# Patient Record
Sex: Female | Born: 1980 | Race: White | Hispanic: No | Marital: Married | Smoking: Current every day smoker
Health system: Southern US, Community
[De-identification: ages and names within clinical notes are randomized; demographics above are authoritative.]

## PROBLEM LIST (undated history)

## (undated) DIAGNOSIS — N946 Dysmenorrhea, unspecified: Secondary | ICD-10-CM

## (undated) DIAGNOSIS — R569 Unspecified convulsions: Secondary | ICD-10-CM

## (undated) DIAGNOSIS — G35 Multiple sclerosis: Secondary | ICD-10-CM

## (undated) DIAGNOSIS — F419 Anxiety disorder, unspecified: Secondary | ICD-10-CM

## (undated) DIAGNOSIS — H539 Unspecified visual disturbance: Secondary | ICD-10-CM

## (undated) HISTORY — DX: Multiple sclerosis: G35

## (undated) HISTORY — DX: Anxiety disorder, unspecified: F41.9

## (undated) HISTORY — DX: Dysmenorrhea, unspecified: N94.6

## (undated) HISTORY — PX: TONSILLECTOMY: SUR1361

## (undated) HISTORY — DX: Unspecified visual disturbance: H53.9

## (undated) HISTORY — DX: Unspecified convulsions: R56.9

---
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
## 2003-01-17 ENCOUNTER — Other Ambulatory Visit: Admission: RE | Admit: 2003-01-17 | Discharge: 2003-01-17 | Payer: Self-pay | Admitting: Obstetrics and Gynecology

## 2004-01-31 ENCOUNTER — Other Ambulatory Visit: Admission: RE | Admit: 2004-01-31 | Discharge: 2004-01-31 | Payer: Self-pay | Admitting: Obstetrics and Gynecology

## 2004-06-27 ENCOUNTER — Other Ambulatory Visit: Admission: RE | Admit: 2004-06-27 | Discharge: 2004-06-27 | Payer: Self-pay | Admitting: Obstetrics and Gynecology

## 2005-01-30 ENCOUNTER — Other Ambulatory Visit: Admission: RE | Admit: 2005-01-30 | Discharge: 2005-01-30 | Payer: Self-pay | Admitting: Obstetrics and Gynecology

## 2011-03-22 ENCOUNTER — Ambulatory Visit: Payer: Self-pay | Admitting: Obstetrics and Gynecology

## 2011-04-01 ENCOUNTER — Ambulatory Visit: Payer: Self-pay | Admitting: Obstetrics and Gynecology

## 2011-04-11 ENCOUNTER — Ambulatory Visit (INDEPENDENT_AMBULATORY_CARE_PROVIDER_SITE_OTHER): Payer: BC Managed Care – PPO | Admitting: Obstetrics and Gynecology

## 2011-04-11 DIAGNOSIS — R350 Frequency of micturition: Secondary | ICD-10-CM

## 2011-04-11 DIAGNOSIS — N898 Other specified noninflammatory disorders of vagina: Secondary | ICD-10-CM

## 2011-04-11 DIAGNOSIS — Z01419 Encounter for gynecological examination (general) (routine) without abnormal findings: Secondary | ICD-10-CM

## 2011-04-11 DIAGNOSIS — Z3040 Encounter for surveillance of contraceptives, unspecified: Secondary | ICD-10-CM

## 2011-04-16 ENCOUNTER — Other Ambulatory Visit: Payer: BC Managed Care – PPO

## 2011-04-16 DIAGNOSIS — N979 Female infertility, unspecified: Secondary | ICD-10-CM

## 2011-04-16 LAB — PROGESTERONE: Progesterone: 9.2 ng/mL

## 2011-04-16 LAB — PROLACTIN: Prolactin: 8.8 ng/mL

## 2011-04-16 LAB — TSH: TSH: 2.264 u[IU]/mL (ref 0.350–4.500)

## 2011-04-23 ENCOUNTER — Telehealth: Payer: Self-pay | Admitting: Obstetrics and Gynecology

## 2011-04-23 DIAGNOSIS — N979 Female infertility, unspecified: Secondary | ICD-10-CM

## 2011-04-23 NOTE — Telephone Encounter (Signed)
Return call to patient who called to give the first day of her menstrual period.  Left message requesting that patient give [place] that date so that we can go on and schedule her HSG and day 21 progesterone.

## 2011-04-23 NOTE — Telephone Encounter (Signed)
Routed to EP.

## 2011-04-24 ENCOUNTER — Telehealth: Payer: Self-pay | Admitting: Obstetrics and Gynecology

## 2011-04-24 NOTE — Telephone Encounter (Signed)
Telephone call to patient who states that her menses started 04/23/11.  Plans were to schedule an HSG but patient wants to postpone that study for now. Had TSH, d21 progesterone and prolactin done on 4/9 and has an appointment already scheduled with Dr. Su Hilt.  States husband doing his semen analysis today.

## 2011-04-24 NOTE — Telephone Encounter (Signed)
Return call to patient to find out the exact date of her LMP.  Left message to have her return call.

## 2011-05-09 ENCOUNTER — Encounter: Payer: Self-pay | Admitting: Obstetrics and Gynecology

## 2011-05-09 ENCOUNTER — Ambulatory Visit (INDEPENDENT_AMBULATORY_CARE_PROVIDER_SITE_OTHER): Payer: BC Managed Care – PPO | Admitting: Obstetrics and Gynecology

## 2011-05-09 VITALS — BP 102/74 | Resp 14 | Ht 61.75 in | Wt 159.0 lb

## 2011-05-09 DIAGNOSIS — N970 Female infertility associated with anovulation: Secondary | ICD-10-CM

## 2011-05-09 NOTE — Progress Notes (Signed)
Trying to conceive.  Husband handed in SA to [facility]. Filed Vitals:   05/09/11 0859  BP: 102/74  Resp: 14   A/P Labs reviewed - prog 9.2 likely anovulatory Check fasting glucose Get SA results - if normal, femara 2.5mg  day 3 of cycle x 5days - day 21 prog and then f/u with me

## 2011-05-10 ENCOUNTER — Telehealth: Payer: Self-pay

## 2011-05-10 NOTE — Telephone Encounter (Signed)
LM w/ pt to cb  In re: to obtaining results from [facility]. Chelsea Gentry A

## 2011-05-10 NOTE — Telephone Encounter (Signed)
Called in Femara 2.5 mg # 5 Day 3-5 of menses, per AR. Melody Comas A

## 2011-05-14 ENCOUNTER — Telehealth: Payer: Self-pay

## 2011-05-14 NOTE — Telephone Encounter (Signed)
Correction for prev note re: Femara. It should be taken days 3-7 of cycle, not days 3-5 as erroneously documented. Melody Comas A

## 2011-05-20 ENCOUNTER — Telehealth: Payer: Self-pay

## 2011-05-20 NOTE — Telephone Encounter (Signed)
Spoke to pt to sched her 21 Day progesterone. It falls on a Sat. June 1st. Pt will go to [facility]. [facility]. Follow up appt booked with AR on 06/13/2011. Pt states that Dr. Su Hilt wanted to do a fasting glucose too. So we will add to order for 21 Day Prog. Melody Comas A

## 2011-06-08 ENCOUNTER — Other Ambulatory Visit: Payer: Self-pay | Admitting: Obstetrics and Gynecology

## 2011-06-08 LAB — GLUCOSE, RANDOM: Glucose, Bld: 88 mg/dL (ref 70–99)

## 2011-06-08 LAB — PROGESTERONE: Progesterone: 28.8 ng/mL

## 2011-06-13 ENCOUNTER — Ambulatory Visit (INDEPENDENT_AMBULATORY_CARE_PROVIDER_SITE_OTHER): Payer: BC Managed Care – PPO | Admitting: Obstetrics and Gynecology

## 2011-06-13 ENCOUNTER — Encounter: Payer: Self-pay | Admitting: Obstetrics and Gynecology

## 2011-06-13 VITALS — BP 106/72 | Ht 62.0 in | Wt 164.0 lb

## 2011-06-13 DIAGNOSIS — N970 Female infertility associated with anovulation: Secondary | ICD-10-CM

## 2011-06-13 MED ORDER — LETROZOLE 2.5 MG PO TABS: 2.5000 mg | ORAL_TABLET | Freq: Every day | ORAL | Status: AC

## 2011-06-13 NOTE — Progress Notes (Signed)
Pt is following up for progesterone and fasting glucose labs done on 06/08/11.  Labs reviewed.  Pt is ovulatory on 2.5mg  femara with prog of 28.  Fasting glucose was nl.  SA was nl I discussed plan with pt.  If no conception after total of 3 cycles on femara, rto and will schedule HSG with rec for femara, HCG and IUI. Pt will call to schedule that next appt whether she is preg or needs to proceed with HSG. Pt instructed to check UPT if cycle is not nl and do not take med. Questions answered.

## 2012-01-13 ENCOUNTER — Encounter (HOSPITAL_BASED_OUTPATIENT_CLINIC_OR_DEPARTMENT_OTHER): Payer: Self-pay | Admitting: *Deleted

## 2012-01-13 ENCOUNTER — Emergency Department (HOSPITAL_BASED_OUTPATIENT_CLINIC_OR_DEPARTMENT_OTHER): Payer: BC Managed Care – PPO

## 2012-01-13 ENCOUNTER — Emergency Department (HOSPITAL_BASED_OUTPATIENT_CLINIC_OR_DEPARTMENT_OTHER)
Admission: EM | Admit: 2012-01-13 | Discharge: 2012-01-13 | Disposition: A | Discharge status: Home / Self Care | Payer: BC Managed Care – PPO | Attending: Emergency Medicine | Admitting: Emergency Medicine

## 2012-01-13 DIAGNOSIS — Z3202 Encounter for pregnancy test, result negative: Secondary | ICD-10-CM | POA: Insufficient documentation

## 2012-01-13 DIAGNOSIS — Z8669 Personal history of other diseases of the nervous system and sense organs: Secondary | ICD-10-CM | POA: Insufficient documentation

## 2012-01-13 DIAGNOSIS — Z8742 Personal history of other diseases of the female genital tract: Secondary | ICD-10-CM | POA: Insufficient documentation

## 2012-01-13 DIAGNOSIS — N200 Calculus of kidney: Secondary | ICD-10-CM | POA: Insufficient documentation

## 2012-01-13 DIAGNOSIS — R350 Frequency of micturition: Secondary | ICD-10-CM | POA: Insufficient documentation

## 2012-01-13 DIAGNOSIS — N949 Unspecified condition associated with female genital organs and menstrual cycle: Secondary | ICD-10-CM | POA: Insufficient documentation

## 2012-01-13 DIAGNOSIS — F17200 Nicotine dependence, unspecified, uncomplicated: Secondary | ICD-10-CM | POA: Insufficient documentation

## 2012-01-13 DIAGNOSIS — Z8659 Personal history of other mental and behavioral disorders: Secondary | ICD-10-CM | POA: Insufficient documentation

## 2012-01-13 LAB — URINALYSIS, ROUTINE W REFLEX MICROSCOPIC
Glucose, UA: NEGATIVE mg/dL
Ketones, ur: NEGATIVE mg/dL
Leukocytes, UA: NEGATIVE
Nitrite: NEGATIVE
Protein, ur: NEGATIVE mg/dL

## 2012-01-13 LAB — PREGNANCY, URINE: Preg Test, Ur: NEGATIVE

## 2012-01-13 MED ORDER — OXYCODONE-ACETAMINOPHEN 5-325 MG PO TABS
2.0000 | ORAL_TABLET | Freq: Once | ORAL | Status: AC
  Administered 2012-01-13: 2 via ORAL
  Filled 2012-01-13 (×2): qty 2

## 2012-01-13 MED ORDER — TAMSULOSIN HCL 0.4 MG PO CAPS: 0.4000 mg | ORAL_CAPSULE | Freq: Every day | ORAL | Status: DC

## 2012-01-13 MED ORDER — KETOROLAC TROMETHAMINE 60 MG/2ML IM SOLN
60.0000 mg | Freq: Once | INTRAMUSCULAR | Status: AC
  Administered 2012-01-13: 60 mg via INTRAMUSCULAR
  Filled 2012-01-13: qty 2

## 2012-01-13 MED ORDER — OXYCODONE-ACETAMINOPHEN 5-325 MG PO TABS: 1.0000 | ORAL_TABLET | Freq: Four times a day (QID) | ORAL | Status: DC | PRN

## 2012-01-13 NOTE — ED Notes (Signed)
MD with pt  

## 2012-01-13 NOTE — ED Notes (Signed)
Pt. C/o of sudden onset of lower abd pain that started at 2230 last pm. States pain radiates into her left flank area. Pt states pain improves with urination. C/o urinary frequency but urinating small amounts each time. Denies any fevers. Denies n/v. Denies hx of kidney stones. Denies  Any injury to her back/pain area.

## 2012-01-13 NOTE — ED Provider Notes (Signed)
History     CSN: [id number]  Arrival date & time 01/13/12  1478   First MD Initiated Contact with Patient 01/13/12 [phone]      Chief Complaint  Patient presents with  . left flank and pelvic pain     (Consider location/radiation/quality/duration/timing/severity/associated sxs/prior treatment) Patient is a 32 y.o. female presenting with flank pain. The history is provided by the patient.  Flank Pain This is a new problem. The current episode started 6 to 12 hours ago. The problem occurs constantly. The problem has not changed since onset.Associated symptoms include abdominal pain. Pertinent negatives include no chest pain, no headaches and no shortness of breath. Nothing aggravates the symptoms. Nothing relieves the symptoms. She has tried nothing for the symptoms. The treatment provided no relief.  Has also suprapubic pain as well.    Past Medical History  Diagnosis Date  . Seizures   . Anxiety   . Dysmenorrhea     History reviewed. No pertinent past surgical history.  No family history on file.  History  Substance Use Topics  . Smoking status: Current Every Day Smoker  . Smokeless tobacco: Never Used  . Alcohol Use: Yes     Comment: weekends     OB History    Grav Para Term Preterm Abortions TAB SAB Ect Mult Living   0 0              Review of Systems  Respiratory: Negative for shortness of breath.   Cardiovascular: Negative for chest pain.  Gastrointestinal: Positive for abdominal pain.  Genitourinary: Positive for frequency and flank pain.  Neurological: Negative for headaches.  All other systems reviewed and are negative.    Allergies  Review of patient's allergies indicates no known allergies.  Home Medications   Current Outpatient Rx  Name  Route  Sig  Dispense  Refill  . OXYCODONE-ACETAMINOPHEN 5-325 MG PO TABS   Oral   Take 1 tablet by mouth every 6 (six) hours as needed for pain.   13 tablet   0   . TAMSULOSIN HCL 0.4 MG PO CAPS   Oral  Take 1 capsule (0.4 mg total) by mouth daily.   7 capsule   0     BP 130/75  Pulse 84  Temp 98.2 F (36.8 C) (Oral)  Resp 20  Ht 5\' 2"  (1.575 m)  Wt 165 lb (74.844 kg)  BMI 30.18 kg/m2  SpO2 99%  LMP 01/13/2012  Physical Exam  Constitutional: She is oriented to person, place, and time. She appears well-developed and well-nourished. No distress.  HENT:  Head: Normocephalic and atraumatic.  Mouth/Throat: Oropharynx is clear and moist.  Eyes: Conjunctivae normal are normal. Pupils are equal, round, and reactive to light.  Neck: Normal range of motion. Neck supple.  Cardiovascular: Normal rate, regular rhythm and intact distal pulses.   Pulmonary/Chest: Effort normal and breath sounds normal. She has no wheezes. She has no rales.  Abdominal: Soft. Bowel sounds are normal. There is no tenderness. There is no rebound and no guarding.  Musculoskeletal: Normal range of motion.  Neurological: She is alert and oriented to person, place, and time.  Skin: Skin is warm and dry.  Psychiatric: She has a normal mood and affect.    ED Course  Procedures (including critical care time)  Labs Reviewed  URINALYSIS, ROUTINE W REFLEX MICROSCOPIC - Abnormal; Notable for the following:    APPearance CLOUDY (*)     Hgb urine dipstick LARGE (*)  All other components within normal limits  URINE MICROSCOPIC-ADD ON - Abnormal; Notable for the following:    Bacteria, UA FEW (*)     All other components within normal limits  PREGNANCY, URINE   Ct Abdomen Pelvis Wo Contrast  01/13/2012  *RADIOLOGY REPORT*  Clinical Data: Lower abdominal pain.  CT ABDOMEN AND PELVIS WITHOUT CONTRAST  Technique:  Multidetector CT imaging of the abdomen and pelvis was performed following the standard protocol without intravenous contrast.  Comparison: None.  Findings: The lung bases are clear.  No pleural or pericardial effusion.  The gallbladder, liver, spleen, adrenal glands and pancreas appear normal.  The right  kidney is unremarkable.  The patient has a punctate nonobstructing stone in the lower pole of the left kidney. There is mild to moderate left hydronephrosis due to a punctate stone just proximal to the UVJ.  The urinary bladder is decompressed but otherwise unremarkable.  Uterus and adnexa appear normal.  Trace amount of free pelvic fluid is likely physiologic. The stomach, small and large bowel and appendix appear normal. There is no focal bony abnormality.  IMPRESSION: Mild to moderate left hydronephrosis due to a punctate distal left ureteral stone.  A punctate nonobstructing stone in the lower pole of the left kidney also is seen.   Original Report Authenticated By: Holley Dexter, M.D.      1. Kidney stone       MDM  Strain urine follow up with urology for ongoing care        Aison Malveaux K Conlee Sliter-Rasch, MD 01/13/12 [phone]

## 2012-01-13 NOTE — ED Notes (Signed)
Pt states she feels much better after pain meds. Urine strainer given to pt with instructions.

## 2012-01-13 NOTE — ED Notes (Signed)
Pt c/o increase in pain level. 9/10. MD updated and orders received.

## 2012-01-14 ENCOUNTER — Encounter: Payer: Self-pay | Admitting: Obstetrics and Gynecology

## 2012-01-14 ENCOUNTER — Ambulatory Visit (INDEPENDENT_AMBULATORY_CARE_PROVIDER_SITE_OTHER): Payer: BC Managed Care – PPO | Admitting: Obstetrics and Gynecology

## 2012-01-14 VITALS — BP 102/64 | Resp 14 | Ht 62.0 in | Wt 165.0 lb

## 2012-01-14 DIAGNOSIS — N970 Female infertility associated with anovulation: Secondary | ICD-10-CM

## 2012-01-14 MED ORDER — LETROZOLE 2.5 MG PO TABS: 2.5000 mg | ORAL_TABLET | Freq: Every day | ORAL | Status: DC

## 2012-01-14 NOTE — Progress Notes (Signed)
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  No success after 3 cycles on femara.  Filed Vitals:   01/14/12 1204  BP: 102/64  Resp: 14   ROS: noncontributory  Pelvic exam:  VULVA: normal appearing vulva with no masses, tenderness or lesions,  VAGINA: normal appearing vagina with normal color and discharge, no lesions, CERVIX: normal appearing cervix without discharge or lesions, on cycle UTERUS: uterus is normal size, shape, consistency and nontender,  ADNEXA: normal adnexa in size, nontender and no masses.  A/P sched HSG, do Femara 2.5mg , recheck day 21 prog, try x If no success do IUI with HCG If no success refer to REI, discussed with pt

## 2012-01-16 ENCOUNTER — Telehealth: Payer: Self-pay | Admitting: Obstetrics and Gynecology

## 2012-01-16 ENCOUNTER — Other Ambulatory Visit: Payer: Self-pay | Admitting: Obstetrics and Gynecology

## 2012-01-16 DIAGNOSIS — N970 Female infertility associated with anovulation: Secondary | ICD-10-CM

## 2012-01-16 NOTE — Telephone Encounter (Signed)
Spoke with [name] msg Gentry had questions when to start Femara advised Gentry day 3-7 of cycle Gentry states today day just picked up rx advised Gentry can wait until next cycle to do Femara Gentry voic understanding Gentry will call 1st day of her cycle to sch d21 and f/u with Ar

## 2012-01-16 NOTE — Telephone Encounter (Signed)
Tc to pt to schedule HSG, appt scheduled for Tuesday 01/21/12 @ 0830, pt to arrive @ 0800, pt LMP 01/12/12.  Pt also asks about taking the Femara, states she missed taking the med on day 3, informed after consultation w/ AR, may take the med days 5-9 of cycle which starts today.  Pt voices agreement.

## 2012-01-21 ENCOUNTER — Ambulatory Visit (HOSPITAL_COMMUNITY)
Admission: RE | Admit: 2012-01-21 | Discharge: 2012-01-21 | Disposition: A | Discharge status: Home / Self Care | Payer: BC Managed Care – PPO | Source: Ambulatory Visit | Attending: Obstetrics and Gynecology | Admitting: Obstetrics and Gynecology

## 2012-01-21 DIAGNOSIS — N970 Female infertility associated with anovulation: Secondary | ICD-10-CM

## 2012-01-21 DIAGNOSIS — N979 Female infertility, unspecified: Secondary | ICD-10-CM | POA: Insufficient documentation

## 2012-01-21 MED ORDER — IOHEXOL 300 MG/ML  SOLN
7.0000 mL | Freq: Once | INTRAMUSCULAR | Status: AC | PRN
  Administered 2012-01-21: 7 mL

## 2012-01-21 MED ORDER — IOHEXOL 300 MG/ML  SOLN: 100.0000 mL | Freq: Once | INTRAMUSCULAR | Status: DC | PRN

## 2012-01-31 ENCOUNTER — Telehealth: Payer: Self-pay

## 2012-01-31 ENCOUNTER — Telehealth: Payer: Self-pay | Admitting: Obstetrics and Gynecology

## 2012-01-31 ENCOUNTER — Other Ambulatory Visit: Payer: Self-pay

## 2012-01-31 DIAGNOSIS — N970 Female infertility associated with anovulation: Secondary | ICD-10-CM

## 2012-01-31 NOTE — Telephone Encounter (Signed)
PT is suppose to have progesterone levels that are suppose to be checked on 21st day of cycle. Pt states that she needs an order to [facility] lab to be sent so that she can have labs drawn tomorrow. Pt will be going to lab located on [address] tomorrow.

## 2012-01-31 NOTE — Telephone Encounter (Signed)
Loney Loh was called to confirm order was received per Thayer Ohm Order was received.  [facility] CMA

## 2012-01-31 NOTE — Telephone Encounter (Signed)
[facility] order faxed because they stated they could not see future orders.  [facility] CMA

## 2012-02-01 ENCOUNTER — Other Ambulatory Visit: Payer: Self-pay | Admitting: Obstetrics and Gynecology

## 2012-02-07 ENCOUNTER — Ambulatory Visit: Payer: BC Managed Care – PPO | Admitting: Obstetrics and Gynecology

## 2012-02-07 ENCOUNTER — Encounter: Payer: Self-pay | Admitting: Obstetrics and Gynecology

## 2012-02-07 ENCOUNTER — Encounter: Payer: BC Managed Care – PPO | Admitting: Obstetrics and Gynecology

## 2012-02-07 VITALS — BP 112/62 | Ht 62.0 in | Wt 163.0 lb

## 2012-02-07 DIAGNOSIS — Z319 Encounter for procreative management, unspecified: Secondary | ICD-10-CM

## 2012-02-07 DIAGNOSIS — Z139 Encounter for screening, unspecified: Secondary | ICD-10-CM

## 2012-02-07 MED ORDER — LETROZOLE 2.5 MG PO TABS: 2.5000 mg | ORAL_TABLET | Freq: Every day | ORAL | Status: DC

## 2012-02-07 NOTE — Progress Notes (Signed)
Here to f/u HSG and 1st round s/p HSG pof 2.5mg  of femara  Filed Vitals:   02/07/12 1204  BP: 112/62   UPT per pt neg yest  A/P Femara 2.5mg  next 2 cycles - rx sent RTO 3-29mths If no conception at that time, I rec IUI with femara or referral to REI - husband had nl SA already

## 2012-02-10 ENCOUNTER — Telehealth: Payer: Self-pay | Admitting: Obstetrics and Gynecology

## 2012-02-10 MED ORDER — LETROZOLE 2.5 MG PO TABS: 2.5000 mg | ORAL_TABLET | Freq: Every day | ORAL | Status: DC

## 2012-02-10 NOTE — Telephone Encounter (Signed)
Sent rx to pharmacy  Darien Ramus, CMA

## 2013-12-18 ENCOUNTER — Inpatient Hospital Stay (HOSPITAL_COMMUNITY): Payer: 59

## 2013-12-18 ENCOUNTER — Encounter (HOSPITAL_COMMUNITY): Payer: Self-pay

## 2013-12-18 ENCOUNTER — Ambulatory Visit (HOSPITAL_COMMUNITY)
Admission: AD | Admit: 2013-12-18 | Discharge: 2013-12-18 | Disposition: A | Discharge status: Home / Self Care | Payer: 59 | Source: Ambulatory Visit | Attending: Obstetrics and Gynecology | Admitting: Obstetrics and Gynecology

## 2013-12-18 ENCOUNTER — Encounter (HOSPITAL_COMMUNITY)
Admission: AD | Disposition: A | Discharge status: Home / Self Care | Payer: Self-pay | Source: Ambulatory Visit | Attending: Obstetrics and Gynecology

## 2013-12-18 DIAGNOSIS — O021 Missed abortion: Secondary | ICD-10-CM | POA: Insufficient documentation

## 2013-12-18 DIAGNOSIS — Z6833 Body mass index (BMI) 33.0-33.9, adult: Secondary | ICD-10-CM | POA: Diagnosis not present

## 2013-12-18 DIAGNOSIS — O034 Incomplete spontaneous abortion without complication: Secondary | ICD-10-CM

## 2013-12-18 DIAGNOSIS — F17200 Nicotine dependence, unspecified, uncomplicated: Secondary | ICD-10-CM | POA: Insufficient documentation

## 2013-12-18 HISTORY — PX: DILATION AND EVACUATION: SHX1459

## 2013-12-18 LAB — CBC
HCT: 31.5 % — ABNORMAL LOW (ref 36.0–46.0)
HCT: 30.1 % — ABNORMAL LOW (ref 36.0–46.0)
HEMOGLOBIN: 10.9 g/dL — AB (ref 12.0–15.0)
Hemoglobin: 10.4 g/dL — ABNORMAL LOW (ref 12.0–15.0)
MCH: 30.4 pg (ref 26.0–34.0)
MCH: 30.4 pg (ref 26.0–34.0)
MCHC: 34.6 g/dL (ref 30.0–36.0)
MCHC: 34.6 g/dL (ref 30.0–36.0)
MCV: 88 fL (ref 78.0–100.0)
MCV: 88 fL (ref 78.0–100.0)
PLATELETS: 229 10*3/uL (ref 150–400)
Platelets: 265 10*3/uL (ref 150–400)
RBC: 3.58 MIL/uL — AB (ref 3.87–5.11)
RBC: 3.42 MIL/uL — ABNORMAL LOW (ref 3.87–5.11)
RDW: 12.2 % (ref 11.5–15.5)
RDW: 12.2 % (ref 11.5–15.5)
WBC: 13.8 10*3/uL — AB (ref 4.0–10.5)
WBC: 23.8 10*3/uL — ABNORMAL HIGH (ref 4.0–10.5)

## 2013-12-18 LAB — PROTIME-INR
INR: 1.28 (ref 0.00–1.49)
PROTHROMBIN TIME: 16.1 s — AB (ref 11.6–15.2)

## 2013-12-18 LAB — D-DIMER, QUANTITATIVE (NOT AT ARMC)

## 2013-12-18 LAB — APTT: aPTT: 35 seconds (ref 24–37)

## 2013-12-18 LAB — PREPARE RBC (CROSSMATCH)

## 2013-12-18 LAB — FIBRINOGEN: Fibrinogen: 358 mg/dL (ref 204–475)

## 2013-12-18 LAB — ABO/RH: ABO/RH(D): O POS

## 2013-12-18 SURGERY — DILATION AND EVACUATION, UTERUS
Anesthesia: General

## 2013-12-18 MED ORDER — DEXAMETHASONE SODIUM PHOSPHATE 4 MG/ML IJ SOLN
INTRAMUSCULAR | Status: DC | PRN
  Administered 2013-12-18: 4 mg via INTRAVENOUS

## 2013-12-18 MED ORDER — KETOROLAC TROMETHAMINE 30 MG/ML IJ SOLN: INTRAMUSCULAR | Status: DC | PRN

## 2013-12-18 MED ORDER — LACTATED RINGERS IV BOLUS (SEPSIS)
1000.0000 mL | INTRAVENOUS | Status: AC
  Administered 2013-12-18: 1000 mL via INTRAVENOUS

## 2013-12-18 MED ORDER — FENTANYL CITRATE 0.05 MG/ML IJ SOLN: 25.0000 ug | INTRAMUSCULAR | Status: DC | PRN

## 2013-12-18 MED ORDER — HYDROCODONE-ACETAMINOPHEN 5-325 MG PO TABS: 1.0000 | ORAL_TABLET | Freq: Four times a day (QID) | ORAL | Status: DC | PRN

## 2013-12-18 MED ORDER — METHYLERGONOVINE MALEATE 0.2 MG PO TABS: 0.2000 mg | ORAL_TABLET | Freq: Four times a day (QID) | ORAL | Status: DC

## 2013-12-18 MED ORDER — BUPIVACAINE HCL (PF) 0.25 % IJ SOLN
INTRAMUSCULAR | Status: DC | PRN
  Administered 2013-12-18: 20 mL

## 2013-12-18 MED ORDER — MIDAZOLAM HCL 2 MG/2ML IJ SOLN
INTRAMUSCULAR | Status: DC | PRN
  Administered 2013-12-18: 2 mg via INTRAVENOUS

## 2013-12-18 MED ORDER — PROPOFOL 10 MG/ML IV BOLUS
INTRAVENOUS | Status: DC | PRN
  Administered 2013-12-18: 160 mg via INTRAVENOUS
  Administered 2013-12-18: 40 mg via INTRAVENOUS

## 2013-12-18 MED ORDER — CITRIC ACID-SODIUM CITRATE 334-500 MG/5ML PO SOLN: 30.0000 mL | ORAL | Status: DC

## 2013-12-18 MED ORDER — METHYLERGONOVINE MALEATE 0.2 MG/ML IJ SOLN
0.2000 mg | Freq: Once | INTRAMUSCULAR | Status: AC
  Administered 2013-12-18: 0.2 mg via INTRAMUSCULAR

## 2013-12-18 MED ORDER — ONDANSETRON HCL 4 MG/2ML IJ SOLN
INTRAMUSCULAR | Status: DC | PRN
  Administered 2013-12-18: 4 mg via INTRAVENOUS

## 2013-12-18 MED ORDER — SUCCINYLCHOLINE CHLORIDE 20 MG/ML IJ SOLN
INTRAMUSCULAR | Status: DC | PRN
  Administered 2013-12-18: 50 mg via INTRAVENOUS

## 2013-12-18 MED ORDER — LACTATED RINGERS IV SOLN
INTRAVENOUS | Status: DC | PRN
  Administered 2013-12-18 (×2): via INTRAVENOUS

## 2013-12-18 MED ORDER — FENTANYL CITRATE 0.05 MG/ML IJ SOLN
INTRAMUSCULAR | Status: DC | PRN
  Administered 2013-12-18 (×3): 50 ug via INTRAVENOUS
  Administered 2013-12-18: 100 ug via INTRAVENOUS

## 2013-12-18 MED ORDER — LIDOCAINE HCL (CARDIAC) 20 MG/ML IV SOLN
INTRAVENOUS | Status: DC | PRN
  Administered 2013-12-18: 70 mg via INTRAVENOUS

## 2013-12-18 MED ORDER — IBUPROFEN 600 MG PO TABS: ORAL_TABLET | ORAL | Status: DC

## 2013-12-18 MED ORDER — BUPIVACAINE HCL (PF) 0.25 % IJ SOLN
INTRAMUSCULAR | Status: AC
  Filled 2013-12-18: qty 30

## 2013-12-18 SURGICAL SUPPLY — 18 items
CATH ROBINSON RED A/P 16FR (CATHETERS) ×2 IMPLANT
CLOTH BEACON ORANGE TIMEOUT ST (SAFETY) ×2 IMPLANT
DECANTER SPIKE VIAL GLASS SM (MISCELLANEOUS) ×2 IMPLANT
GLOVE BIOGEL M 6.5 STRL (GLOVE) ×4 IMPLANT
GLOVE BIOGEL PI IND STRL 6.5 (GLOVE) ×1 IMPLANT
GLOVE BIOGEL PI INDICATOR 6.5 (GLOVE) ×1
GOWN STRL REUS W/TWL LRG LVL3 (GOWN DISPOSABLE) ×4 IMPLANT
KIT BERKELEY 1ST TRIMESTER 3/8 (MISCELLANEOUS) ×2 IMPLANT
NS IRRIG 1000ML POUR BTL (IV SOLUTION) ×2 IMPLANT
PACK VAGINAL MINOR WOMEN LF (CUSTOM PROCEDURE TRAY) ×2 IMPLANT
PAD OB MATERNITY 4.3X12.25 (PERSONAL CARE ITEMS) ×2 IMPLANT
PAD PREP 24X48 CUFFED NSTRL (MISCELLANEOUS) ×2 IMPLANT
SET BERKELEY SUCTION TUBING (SUCTIONS) ×2 IMPLANT
TOWEL OR 17X24 6PK STRL BLUE (TOWEL DISPOSABLE) ×4 IMPLANT
VACURETTE 10 RIGID CVD (CANNULA) IMPLANT
VACURETTE 7MM CVD STRL WRAP (CANNULA) IMPLANT
VACURETTE 8 RIGID CVD (CANNULA) IMPLANT
VACURETTE 9 RIGID CVD (CANNULA) IMPLANT

## 2013-12-18 NOTE — MAU Note (Signed)
Pt in via EMS for heavy bleeding, known miscarriage.

## 2013-12-18 NOTE — Discharge Instructions (Signed)
Incomplete Miscarriage A miscarriage is the sudden loss of an unborn baby (fetus) before the 20th week of pregnancy. In an incomplete miscarriage, parts of the fetus or placenta (afterbirth) remain in the body.  Having a miscarriage can be an emotional experience. Talk with your health care provider about any questions you may have about miscarrying, the grieving process, and your future pregnancy plans. CAUSES   Problems with the fetal chromosomes that make it impossible for the baby to develop normally. Problems with the baby's genes or chromosomes are most often the result of errors that occur by chance as the embryo divides and grows. The problems are not inherited from the parents.  Infection of the cervix or uterus.  Hormone problems.  Problems with the cervix, such as having an incompetent cervix. This is when the tissue in the cervix is not strong enough to hold the pregnancy.  Problems with the uterus, such as an abnormally shaped uterus, uterine fibroids, or congenital abnormalities.  Certain medical conditions.  Smoking, drinking alcohol, or taking illegal drugs.  Trauma. SYMPTOMS   Vaginal bleeding or spotting, with or without cramps or pain.  Pain or cramping in the abdomen or lower back.  Passing fluid, tissue, or blood clots from the vagina. DIAGNOSIS  Your health care provider will perform a physical exam. You may also have an ultrasound to confirm the miscarriage. Blood or urine tests may also be ordered. TREATMENT   Usually, a dilation and curettage (D&C) procedure is performed. During a D&C procedure, the cervix is widened (dilated) and any remaining fetal or placental tissue is gently removed from the uterus.  Antibiotic medicines are prescribed if there is an infection. Other medicines may be given to reduce the size of the uterus (contract) if there is a lot of bleeding.  If you have Rh negative blood and your baby was Rh positive, you will need a Rho (D)  immune globulin shot. This shot will protect any future baby from having Rh blood problems in future pregnancies.  You may be confined to bed rest. This means you should stay in bed and only get up to use the bathroom. HOME CARE INSTRUCTIONS   Rest as directed by your health care provider.  Restrict activity as directed by your health care provider. You may be allowed to continue light activity if curettage was not done but you require further treatment.  Keep track of the number of pads you use each day. Keep track of how soaked (saturated) they are. Record this information.  Do not  use tampons.  Do not douche or have sexual intercourse until approved by your health care provider.  Keep all follow-up appointments for reevaluation and continuing management.  Only take over-the-counter or prescription medicines for pain, fever, or discomfort as directed by your health care provider.  Take antibiotic medicine as directed by your health care provider. Make sure you finish it even if you start to feel better. SEEK IMMEDIATE MEDICAL CARE IF:   You experience severe cramps in your stomach, back, or abdomen.  You have an unexplained temperature (make sure to record these temperatures).  You pass large clots or tissue (save these for your health care provider to inspect).  Your bleeding increases.  You become light-headed, weak, or have fainting episodes. MAKE SURE YOU:   Understand these instructions.  Will watch your condition.  Will get help right away if you are not doing well or get worse. Document Released: 12/24/2004 Document Revised: 05/10/2013 Document Reviewed:   07/23/2012 ExitCare Patient Information 2015 [employer]. This information is not intended to replace advice given to you by your health care provider. Make sure you discuss any questions you have with your health care provider.  

## 2013-12-18 NOTE — Anesthesia Procedure Notes (Signed)
Procedure Name: Intubation Date/Time: 12/18/2013 3:06 PM Performed by: Silverio Lay Pre-anesthesia Checklist: Patient identified, Emergency Drugs available, Suction available and Patient being monitored Patient Re-evaluated:Patient Re-evaluated prior to inductionOxygen Delivery Method: Circle system utilized Preoxygenation: Pre-oxygenation with 100% oxygen Intubation Type: IV induction, Rapid sequence and Cricoid Pressure applied Laryngoscope Size: Miller and 2 Grade View: Grade I Tube type: Oral Tube size: 7.0 mm Number of attempts: 1 Airway Equipment and Method: Stylet Placement Confirmation: ETT inserted through vocal cords under direct vision,  positive ETCO2 and breath sounds checked- equal and bilateral Tube secured with: Tape Dental Injury: Teeth and Oropharynx as per pre-operative assessment

## 2013-12-18 NOTE — Op Note (Signed)
12/18/2013  4:47 PM  PATIENT:  Chelsea Gentry  33 y.o. female  PRE-OPERATIVE DIAGNOSIS:  missed abortion, bleeding  POST-OPERATIVE DIAGNOSIS:  missed abortion, bleeding  PROCEDURE:  Procedure(s): DILATATION AND EVACUATION Ultrasound guided (N/A)  SURGEON:  Surgeon(s) and Role:    * Talal Fritchman J. Richardson Dopp, MD - Primary  PHYSICIAN ASSISTANT:   ASSISTANTS: none   ANESTHESIA:   general  EBL:  Total I/O In: 1000 [I.V.:1000] Out: 100 [Blood:100]  BLOOD ADMINISTERED:none  DRAINS: none   LOCAL MEDICATIONS USED:  MARCAINE     SPECIMEN:  Source of Specimen:  products of conception   DISPOSITION OF SPECIMEN:  PATHOLOGY  COUNTS:  YES  TOURNIQUET:  * No tourniquets in log *  DICTATION: .Dragon Dictation  PLAN OF CARE: Discharge to home after PACU  PATIENT DISPOSITION:  PACU - hemodynamically stable.   Delay start of Pharmacological VTE agent (>24hrs) due to surgical blood loss or risk of bleeding: not applicable  Procedure. Pt was taken to the operating room where she was identified as Chelsea Gentry. She was placed in the dorsal lithotomy position prepped and draped in the usual sterile fashion. Speculum was placed in the vaginal vault. The cervix was dilated to 1 cm. The anterior lip of the cervix was grasped with a single tooth tenaculum and sounded to 8 cm. 10 mm suction currette was introduced.  Suction currettage was performed until all contents were removed. Intraoperative ultrasound was performed and no evidence of products were seen. . The single tooth tenaculum was removed. She was noted to have bleeding from the  Tenaculum site. Hemostasis was obtained with silver nitrate. All instruments were removed from the patients vagina.  Sponge lap and needle counts were correct x 2. Pt was taken to the recovery room awake and in stable condition.

## 2013-12-18 NOTE — Anesthesia Preprocedure Evaluation (Signed)
Anesthesia Evaluation  Patient identified by MRN, date of birth, ID band Patient awake    Reviewed: Allergy & Precautions, H&P , NPO status , Patient's Chart, lab work & pertinent test results  History of Anesthesia Complications Negative for: history of anesthetic complications  Airway Mallampati: II  TM Distance: >3 FB Neck ROM: Full    Dental  (+) Teeth Intact   Pulmonary Current Smoker,  breath sounds clear to auscultation        Cardiovascular negative cardio ROS  Rhythm:Regular Rate:Tachycardia     Neuro/Psych Seizures -,  Anxiety negative psych ROS   GI/Hepatic negative GI ROS, Neg liver ROS,   Endo/Other  Morbid obesity  Renal/GU negative Renal ROS     Musculoskeletal   Abdominal   Peds  Hematology negative hematology ROS (+)   Anesthesia Other Findings   Reproductive/Obstetrics (+) Pregnancy                             Anesthesia Physical Anesthesia Plan  ASA: II  Anesthesia Plan: General   Post-op Pain Management:    Induction: Intravenous, Rapid sequence and Cricoid pressure planned  Airway Management Planned: Oral ETT  Additional Equipment: None  Intra-op Plan:   Post-operative Plan: Extubation in OR  Informed Consent: I have reviewed the patients History and Physical, chart, labs and discussed the procedure including the risks, benefits and alternatives for the proposed anesthesia with the patient or authorized representative who has indicated his/her understanding and acceptance.   Dental advisory given  Plan Discussed with: CRNA and Surgeon  Anesthesia Plan Comments:         Anesthesia Quick Evaluation

## 2013-12-18 NOTE — Anesthesia Postprocedure Evaluation (Signed)
  Anesthesia Post-op Note  Patient: Chelsea Gentry  Procedure(s) Performed: Procedure(s): DILATATION AND EVACUATION Ultrasound guided (N/A)  Patient Location: PACU  Anesthesia Type:General  Level of Consciousness: awake  Airway and Oxygen Therapy: Patient Spontanous Breathing  Post-op Pain: mild  Post-op Assessment: Post-op Vital signs reviewed, Patient's Cardiovascular Status Stable, Respiratory Function Stable, Patent Airway, No signs of Nausea or vomiting and Pain level controlled  Post-op Vital Signs: Reviewed and stable  Last Vitals:  Filed Vitals:   12/18/13 1630  BP: 121/75  Pulse: 110  Temp:   Resp: 20    Complications: No apparent anesthesia complications

## 2013-12-18 NOTE — H&P (Signed)
Chelsea Gentry is an 33 y.o. female. Patient presents during known SAB with increased vaginal bleeding since this morning.  Patient states she passed a "sac" and has been bleeding heavy since.  Spoke with on-call provider and initially wanted to observe.  Arrived to MAU via EMS shortly after phone conversation.  Patient states bleeding appeared to "pick up."    Pertinent Gynecological History: Bleeding: Increased secondary to SAB Contraception: none Blood transfusions: none Sexually transmitted diseases: no past history Previous GYN Procedures: Colposcopy  Last mammogram: N/A Last pap: abnormal: LGSIL Date: 02/05/2013 OB History: G1, P0   Menstrual History: Menarche age: Chelsea Gentry  LMP: 09/24/2013  Past Medical History  Diagnosis Date  . Seizures   . Anxiety   . Dysmenorrhea     No past surgical history on file.  No family history on file.  Social History:  reports that she has been smoking.  She has never used smokeless tobacco. She reports that she drinks alcohol. She reports that she does not use illicit drugs.  Allergies: Shellfish  Prescriptions prior to admission  Medication Sig Dispense Refill Last Dose  . letrozole (FEMARA) 2.5 MG tablet Take 1 tablet (2.5 mg total) by mouth daily. 5 tablet 1   . oxyCODONE-acetaminophen (PERCOCET/ROXICET) 5-325 MG per tablet Take 1 tablet by mouth every 6 (six) hours as needed for pain. 13 tablet 0 Not Taking  . Tamsulosin HCl (FLOMAX) 0.4 MG CAPS Take 1 capsule (0.4 mg total) by mouth daily. 7 capsule 0 Not Taking    ROS Reports bleeding and cramping since Thursday.  Increase in bleeding this am after passing of "sac."  Cramping controlled with po Advil.  All other systems negative.   Blood pressure 115/80, pulse 133, temperature 98.8 F (37.1 C), temperature source Axillary, resp. rate 18, height 5\' 2"  (1.575 m), weight 180 lb (81.647 kg). Physical Exam  Constitutional: She appears well-nourished. She appears distressed.   Cardiovascular: Regular rhythm and normal heart sounds.   Respiratory: Breath sounds normal.  GI: Soft. Bowel sounds are normal.  Genitourinary: There is bleeding in the vagina.  Cervix: Os appears open, actively bleeding Removed several large clots  Musculoskeletal: Normal range of motion.  Skin: Skin is warm and dry.    No results found for this or any previous visit (from the past 24 hour(s)).  No results found.  Assessment: 33y.o. G1P0 12.1wks by LMP Known SAB Heavy Bleeding  Plan: Methergine 0.2mg  IM given Labs: CBC with T&S Dilation and curettage R/B discussed including, but not limited to, increased bleeding, infection, and damage to organs resulting in additional surgery Patient verbalizes understanding and agrees to proceed with procedure.   Chelsea Bast MSN, CNM 12/18/2013, 2:22 PM Patient  Seen and examined. Tachycardia present.  Pelvic exam: Large amount of blood in vaginal vault. Tissue seen at os attempt at removal unsuccessful.  Recommend D&C r/b/a/ were discussed with the patient including but not limited to infection/ bleeding/ damage to uterus with need for further surgery. R/o transfusion HIV/ HepB&C discussed pt voiced understanding and desires to proceed.

## 2013-12-18 NOTE — MAU Provider Note (Signed)
History    Chelsea Gentry is a 33 y.o. G1P0 who presents, after phone call, via EMS for SAB.  Patient reports that she started bleeding Thursday and passed a "sac."  Patient reports that bleeding has been manageable until this morning when she passed another "sac."  Patient states that after this, bleeding increased and had been continuous.  While on phone with provider patient felt bleeding was easing up and wanted to observe.  Patient stated that after hanging up with provider she was unable to put on pad as she was saturating them.  Patient states she then called EMS.  Patient reports last meal yesterday evening and only had coffee and cranberry juice today.    Patient Active Problem List   Diagnosis Date Noted  . Infertility, anovulation 05/09/2011    Chief Complaint  Patient presents with  . Miscarriage   HPI  OB History    Gravida Para Term Preterm AB TAB SAB Ectopic Multiple Living   0 0              Past Medical History  Diagnosis Date  . Seizures   . Anxiety   . Dysmenorrhea     No past surgical history on file.  No family history on file.  History  Substance Use Topics  . Smoking status: Current Every Day Smoker  . Smokeless tobacco: Never Used  . Alcohol Use: Yes     Comment: weekends     Allergies: No Known Allergies  Prescriptions prior to admission  Medication Sig Dispense Refill Last Dose  . letrozole (FEMARA) 2.5 MG tablet Take 1 tablet (2.5 mg total) by mouth daily. 5 tablet 1   . oxyCODONE-acetaminophen (PERCOCET/ROXICET) 5-325 MG per tablet Take 1 tablet by mouth every 6 (six) hours as needed for pain. 13 tablet 0 Not Taking  . Tamsulosin HCl (FLOMAX) 0.4 MG CAPS Take 1 capsule (0.4 mg total) by mouth daily. 7 capsule 0 Not Taking    ROS  See HPI Above Physical Exam   Blood pressure 120/73, pulse 98, temperature 97.4 F (36.3 C), temperature source Axillary, resp. rate 20, height 5\' 2"  (1.575 m), weight 180 lb (81.647 kg), SpO2 97 %.  Results  for orders placed or performed during the hospital encounter of 12/18/13 (from the past 24 hour(s))  Type and screen for Red Blood Exchange     Status: None (Preliminary result)   Collection Time: 12/18/13  2:35 PM  Result Value Ref Range   ABO/RH(D) O POS    Antibody Screen NEG    Sample Expiration 12/21/2013    Unit Number [id number]    Blood Component Type RED CELLS,LR    Unit division 00    Status of Unit ALLOCATED    Transfusion Status OK TO TRANSFUSE    Crossmatch Result Compatible    Unit Number [id number]    Blood Component Type RED CELLS,LR    Unit division 00    Status of Unit ALLOCATED    Transfusion Status OK TO TRANSFUSE    Crossmatch Result Compatible   APTT     Status: None   Collection Time: 12/18/13  2:35 PM  Result Value Ref Range   aPTT 35 24 - 37 seconds  CBC     Status: Abnormal   Collection Time: 12/18/13  2:35 PM  Result Value Ref Range   WBC 13.8 (H) 4.0 - 10.5 K/uL   RBC 3.58 (L) 3.87 - 5.11 MIL/uL   Hemoglobin 10.9 (L) 12.0 -  15.0 g/dL   HCT 16.131.5 (L) 09.636.0 - 04.546.0 %   MCV 88.0 78.0 - 100.0 fL   MCH 30.4 26.0 - 34.0 pg   MCHC 34.6 30.0 - 36.0 g/dL   RDW 40.912.2 81.111.5 - 91.415.5 %   Platelets 229 150 - 400 K/uL  D-dimer, quantitative     Status: None   Collection Time: 12/18/13  2:35 PM  Result Value Ref Range   D-Dimer, Quant <0.27 0.00 - 0.48 ug/mL-FEU  Fibrinogen     Status: None   Collection Time: 12/18/13  2:35 PM  Result Value Ref Range   Fibrinogen 358 204 - 475 mg/dL  Protime-INR     Status: Abnormal   Collection Time: 12/18/13  2:35 PM  Result Value Ref Range   Prothrombin Time 16.1 (H) 11.6 - 15.2 seconds   INR 1.28 0.00 - 1.49  ABO/Rh     Status: None (Preliminary result)   Collection Time: 12/18/13  2:35 PM  Result Value Ref Range   ABO/RH(D) O POS   Prepare RBC (crossmatch)     Status: None   Collection Time: 12/18/13  3:00 PM  Result Value Ref Range   Order Confirmation ORDER PROCESSED BY BLOOD BANK      Physical Exam   Constitutional: She appears well-developed and well-nourished.  Cardiovascular: Regular rhythm and normal heart sounds.   Respiratory: Effort normal and breath sounds normal.  GI: Soft. Bowel sounds are normal. She exhibits no distension.  Genitourinary: There is bleeding in the vagina.  Sterile Speculum Exam: -Vaginal Vault: Blood filled, tissue noted in vault.  Able to remove with ring forcep. -Cervix:  Appears open, actively bleeding from os.  Tissue noted.   Musculoskeletal: Normal range of motion.  Neurological: She is alert.  Skin: Skin is warm. She is diaphoretic.    ED Course  Assessment: 33 y.o. Female Known SAB Heavy Bleeding  Plan: -PE as above -CBC, T&S -Dr. Delrae Sawyers. Cole requested to evaluate patient  Follow Up (1428) -Patient to have D&C with ultrasound guidance -Surgical pathology for specimen removed from vaginal vault -Crossmatch for 2 bags ordered -Pre-op orders placed by Dr. Delrae Sawyers. Cole -Patient to OR in stable condition  Marlene BastMLY, Roniya Tetro LYNN MSN, CNM 12/18/2013 2:13 PM

## 2013-12-18 NOTE — Transfer of Care (Signed)
Immediate Anesthesia Transfer of Care Note  Patient: Chelsea Gentry  Procedure(s) Performed: Procedure(s): DILATATION AND EVACUATION Ultrasound guided (N/A)  Patient Location: PACU  Anesthesia Type:General  Level of Consciousness: awake, alert  and oriented  Airway & Oxygen Therapy: Patient Spontanous Breathing and Patient connected to nasal cannula oxygen  Post-op Assessment: Report given to PACU RN and Post -op Vital signs reviewed and stable  Post vital signs: Reviewed and stable  Complications: No apparent anesthesia complications

## 2013-12-20 ENCOUNTER — Encounter (HOSPITAL_COMMUNITY): Payer: Self-pay | Admitting: Obstetrics and Gynecology

## 2013-12-22 LAB — TYPE AND SCREEN
ABO/RH(D): O POS
Antibody Screen: NEGATIVE
UNIT DIVISION: 0
Unit division: 0

## 2014-07-22 IMAGING — CT CT ABD-PELV W/O CM
2 of 4 series · 16 of 46 positions shown, 18 images · non-contrast
Comparison: None.

CLINICAL DATA: Lower abdominal pain.

CT ABDOMEN AND PELVIS WITHOUT CONTRAST
TECHNIQUE: Multidetector CT imaging of the abdomen and pelvis was
performed following the standard protocol without intravenous
contrast.

[Series 2: renal stone < 200 lbs 5.0 b31f · axial · 0.83mm/px · z∈[+757,+1167]mm · 13 of 90 slices shown, 15 images]
[im 4/90  soft-tissue]
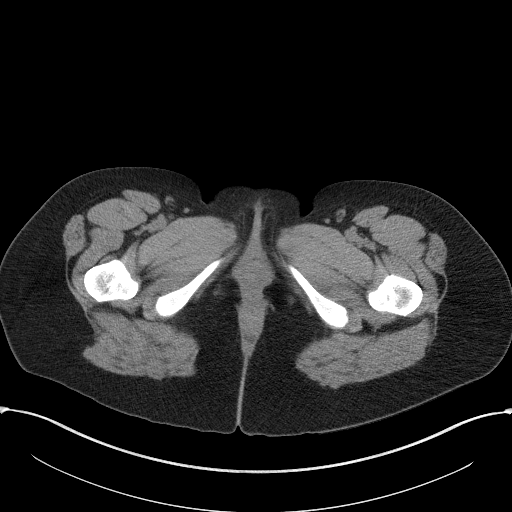
[im 4/90  bone]
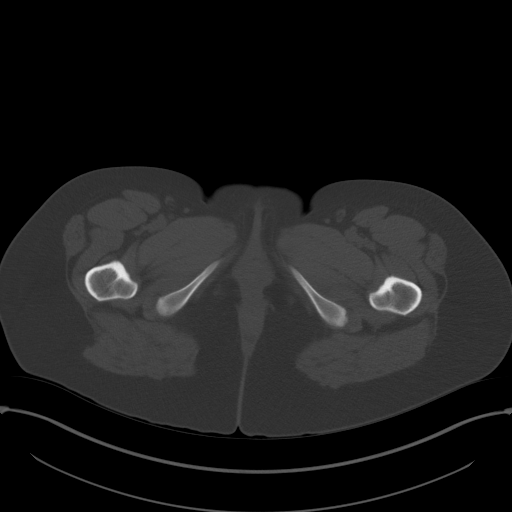
[im 12/90  soft-tissue]
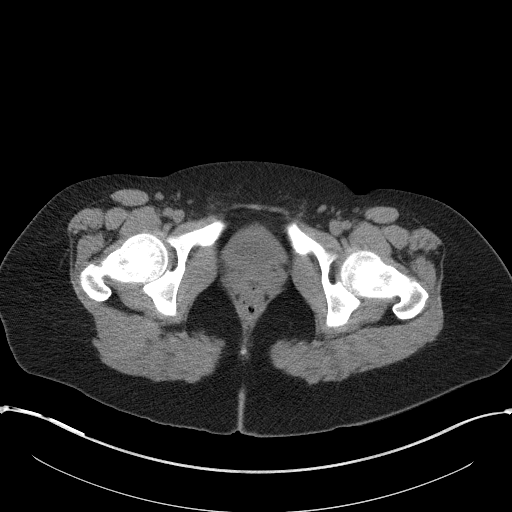
[im 19/90  soft-tissue]
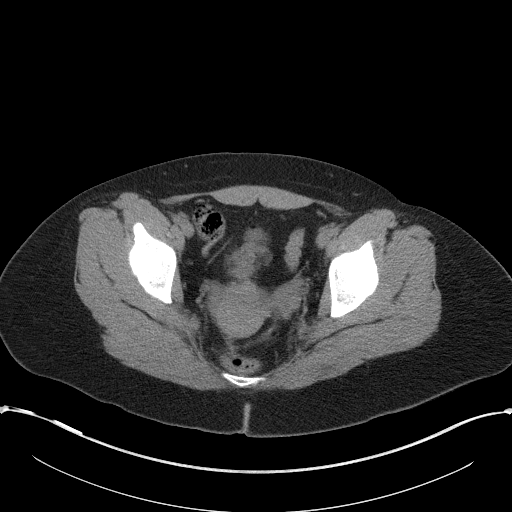
[im 26/90  soft-tissue]
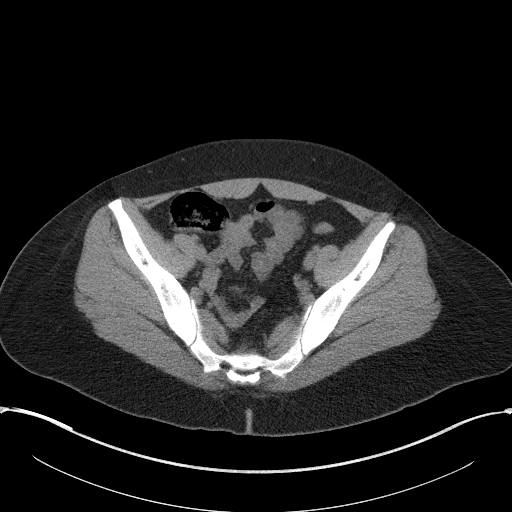
[im 30/90  soft-tissue]
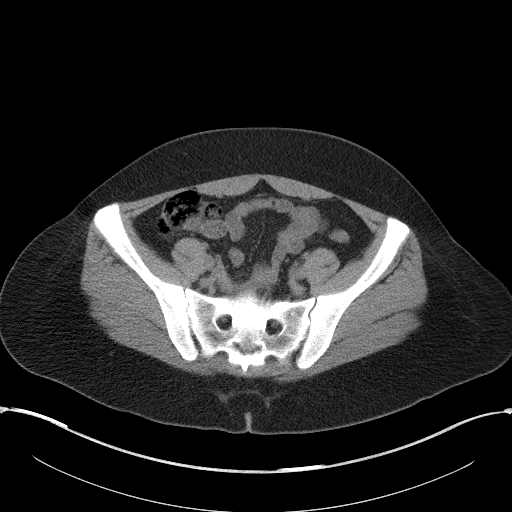
[im 38/90  soft-tissue]
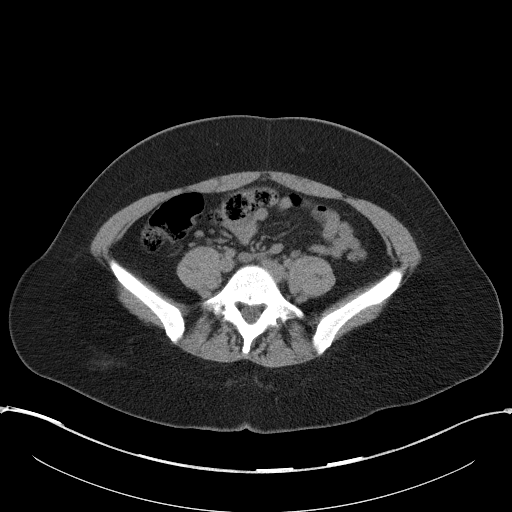
[im 45/90  soft-tissue]
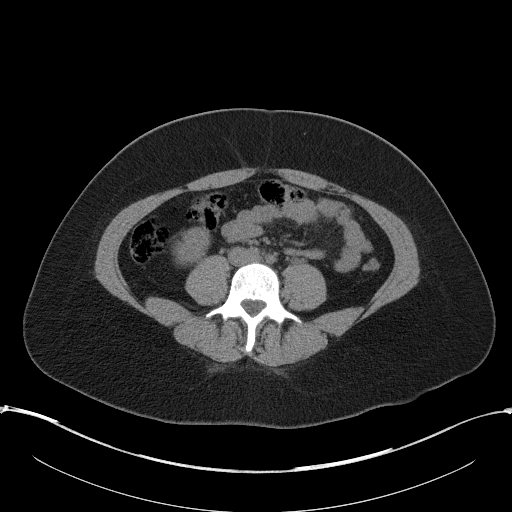
[im 52/90  soft-tissue]
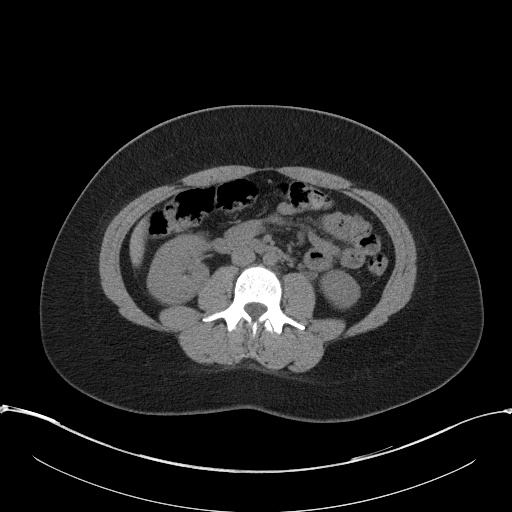
[im 60/90  soft-tissue]
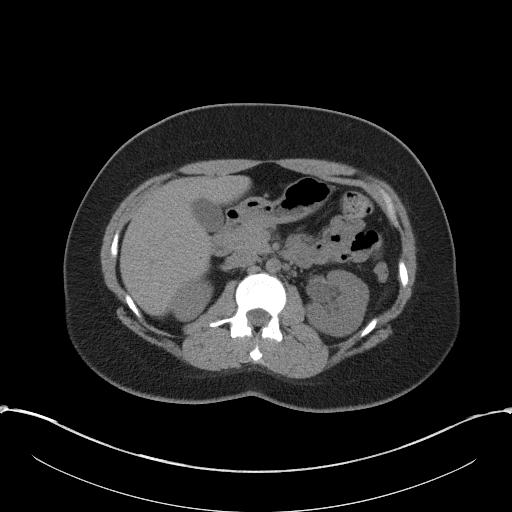
[im 60/90  bone]
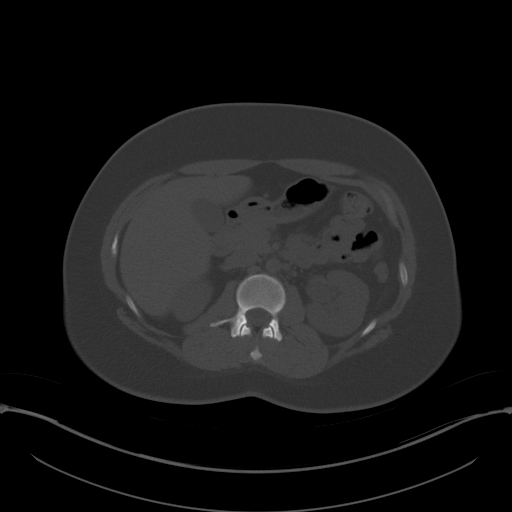
[im 64/90  soft-tissue]
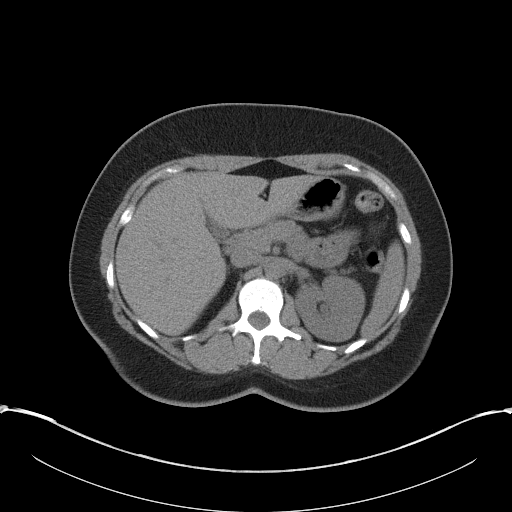
[im 71/90  soft-tissue]
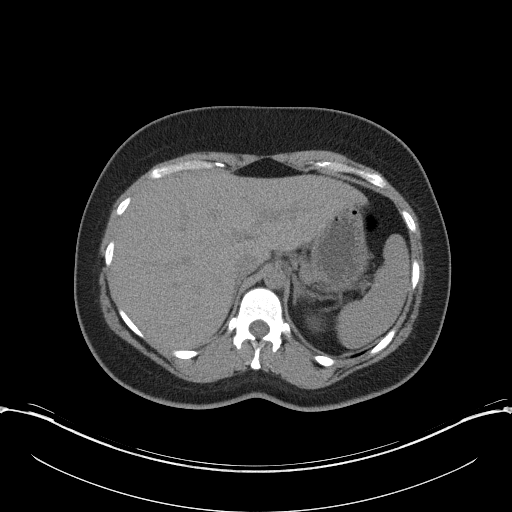
[im 78/90  soft-tissue]
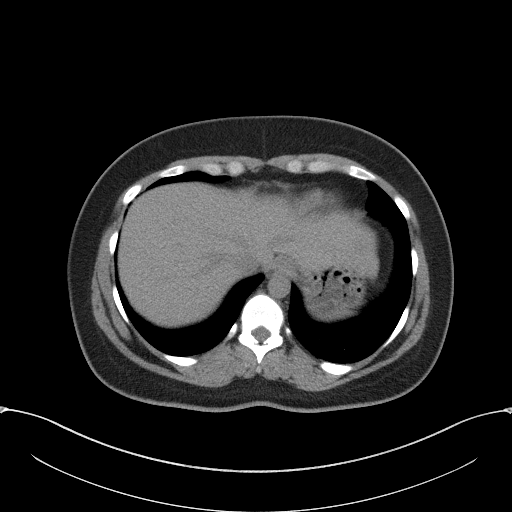
[im 86/90  soft-tissue]
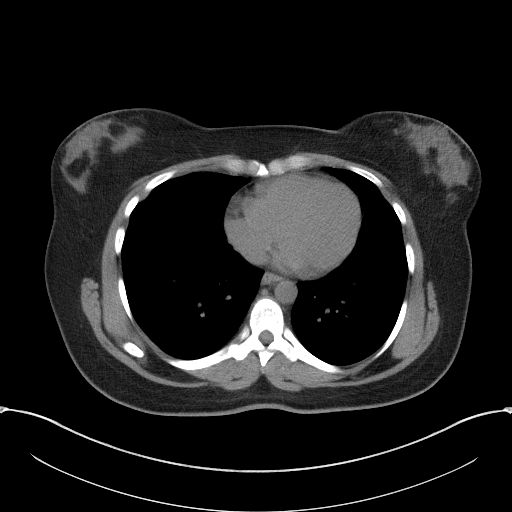

[Series 6: renal stone 3.0 coronal · coronal · 0.82mm/px · 3 of 83 slices shown]
[im 28/83  soft-tissue]
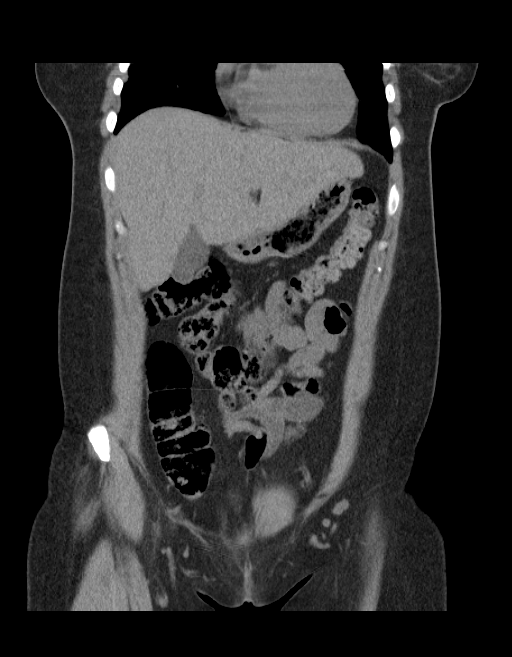
[im 37/83  soft-tissue]
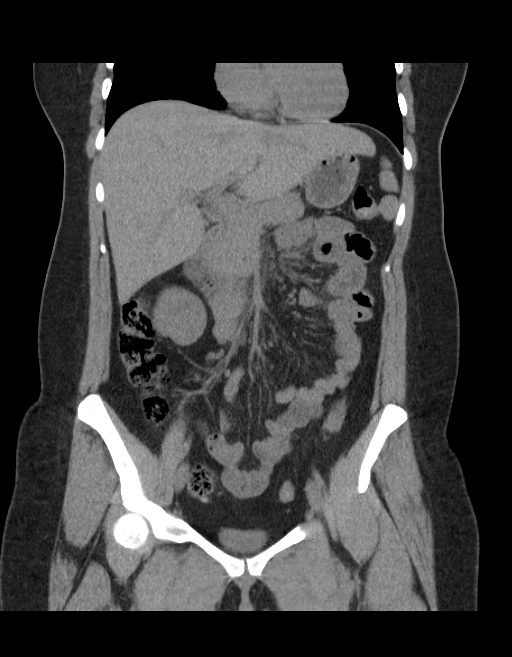
[im 46/83  soft-tissue]
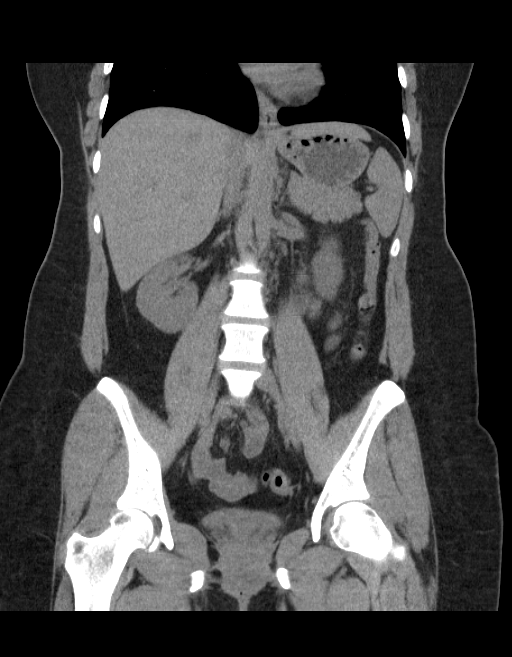

[16 of 46 positions shown; findings below may reference images not displayed]

FINDINGS: The lung bases are clear.  No pleural or pericardial
effusion.

The gallbladder, liver, spleen, adrenal glands and pancreas appear
normal.  The right kidney is unremarkable.  The patient has a
punctate nonobstructing stone in the lower pole of the left kidney.
There is mild to moderate left hydronephrosis due to a punctate
stone just proximal to the UVJ.  The urinary bladder is
decompressed but otherwise unremarkable.  Uterus and adnexa appear
normal.  Trace amount of free pelvic fluid is likely physiologic.
The stomach, small and large bowel and appendix appear normal.
There is no focal bony abnormality.
IMPRESSION: Mild to moderate left hydronephrosis due to a punctate distal left
ureteral stone.  A punctate nonobstructing stone in the lower pole
of the left kidney also is seen.

## 2014-10-11 DIAGNOSIS — H469 Unspecified optic neuritis: Secondary | ICD-10-CM | POA: Insufficient documentation

## 2014-10-23 ENCOUNTER — Encounter (HOSPITAL_COMMUNITY): Payer: Self-pay | Admitting: *Deleted

## 2015-06-15 DIAGNOSIS — G35 Multiple sclerosis: Secondary | ICD-10-CM | POA: Insufficient documentation

## 2016-06-19 ENCOUNTER — Encounter: Payer: Self-pay | Admitting: *Deleted

## 2016-06-19 ENCOUNTER — Encounter: Payer: Self-pay | Admitting: Neurology

## 2016-06-19 ENCOUNTER — Ambulatory Visit (INDEPENDENT_AMBULATORY_CARE_PROVIDER_SITE_OTHER): Payer: BLUE CROSS/BLUE SHIELD | Admitting: Neurology

## 2016-06-19 VITALS — BP 133/89 | HR 72 | Resp 16 | Ht 62.0 in | Wt 176.0 lb

## 2016-06-19 DIAGNOSIS — R7989 Other specified abnormal findings of blood chemistry: Secondary | ICD-10-CM

## 2016-06-19 DIAGNOSIS — G35 Multiple sclerosis: Secondary | ICD-10-CM

## 2016-06-19 DIAGNOSIS — R200 Anesthesia of skin: Secondary | ICD-10-CM | POA: Diagnosis not present

## 2016-06-19 DIAGNOSIS — E559 Vitamin D deficiency, unspecified: Secondary | ICD-10-CM | POA: Diagnosis not present

## 2016-06-19 DIAGNOSIS — H469 Unspecified optic neuritis: Secondary | ICD-10-CM

## 2016-06-19 DIAGNOSIS — G35D Multiple sclerosis, unspecified: Secondary | ICD-10-CM

## 2016-06-19 DIAGNOSIS — Z87898 Personal history of other specified conditions: Secondary | ICD-10-CM

## 2016-06-19 DIAGNOSIS — R5383 Other fatigue: Secondary | ICD-10-CM

## 2016-06-19 NOTE — Progress Notes (Signed)
[facility]  PATIENT: Chelsea Gentry DOB: 03-20-1980  REFERRING DOCTOR OR PCP:  Referred by Dr. Hyacinth Meeker ([facility]);   Henreitta Leber, PA-C ([facility]) SOURCE: patient, notes from Dr. Hyacinth Meeker, labs and imaging reports since 2016,   _________________________________   HISTORICAL  CHIEF COMPLAINT:  Chief Complaint  Patient presents with  . Multiple Sclerosis    Shondra is here with her husband Alinda Money  to transfer care of MS to [facility].  Sts. she was dx. in the fall of 2016.  Presenting sx. was optic neuritis-can't remember which eye. Dx. with MRI only.  Has been followed by Dr. Hyacinth Meeker in [facility].  She has not started any dmt yet--due to trying to get pregnant.  (no children yet--had one miscarriage.)  Sts. recent MRI shows active lesions, so she was referred to [facility] to discuss tx. options.  Sx. that bother her the most are occasional numbness in hands, fatigue, and   . Numbness    h/a's.  Hx. of seizures (5 total while she was in high school--no sz. in 15 yrs.)  She is not currenlty on any sz. med.  Sts. cause for sz. was never determined.    Marland Kitchen History of Seizures    HISTORY OF PRESENT ILLNESS:  I had the pleasure seeing you patient, Chelsea Gentry, at [facility] for neurologic consultation regarding her multiple sclerosis.   Additionally, she has a history of seizures as an adolescent.    She was diagnosed with MS in October 2016. In retrospect, about 6 or 7 years ago she had numbness in her hands more than her feet that was moderate for several weeks before it improved. At that time, she was found to have a low vitamin D and that was blamed for the incident.  In late September 2016, she presented with left visual blurring.    She had an MRI of the brain consistent with MS.   She received po steroids and did better    Her headaches associated with the ON also improved.    She was contemplating pregnancy so did not start any DMT.     She does not think  she has had any definite exacerbation over the past 1 1/2 years since diagnosis.   However, she had a few episodes of severe exhaustion/faigue lasting 1/2 day.   She had a miscarriage last year  And is still hoping to get pregnant.     Currently, she denies difficulty with her gait or with strength.   She still notes tingling in her hands more than her feet.    She often wakes up with mild numbness in her hands but not pain.   This does not wake her up during the night.   Raising her arm over her hand sometimes causes more numbness.     She notes that she will gets some stress incontinence if she coughs or sneezes. Otherwise she does not note much difficulty with urinary frequency, urgency or hesitancy.    She does get some urinary tract infections.  She felt vision got back to baseline.   However, she notes that she will get eye strain easily in that eye.   She deneis any change in color vision.     She reports some fatigue, especially in the heat. She usually sleeps well at night. She tries to make sure to get plenty of sleep may have triggered some seizures in the past.  She denies any significant depression or  anxiety. She has been moody at times. She denies any significant problems with cognition.  She had 5 seizures over 3-4 years around ages [age] but none the past 14-15 years.   Poor sleep may have triggered some of the seizures.   One occurred after taking a diet energy pill.      Her Vit D was low twice in the past, first teste about 6-7 years ago.   She takes 14-21000 U vit D most days.  I personally reviewed the MRIs of the brain from 10/11/2014, 10/13/2014 and 05/20/2016. The MRIs from October 2016 showed enhancement of the left optic nerve consistent with optic neuritis. Additionally, there were multiple T2/FLAIR hyperintense foci in the periventricular, juxtacortical and deep white matter and also one small one in the right cerebellar hemisphere. These were consistent with a diagnosis  of multiple sclerosis. The MRI from 05/20/2016 shows the chronic lesions and additionally shows at least 6 new lesions in the periventricular, deep and juxtacortical white matter with 2 enhancing lesions.    REVIEW OF SYSTEMS: Constitutional: No fevers, chills, sweats, or change in appetite.   Some fatigue Eyes: No visual changes, double vision, eye pain Ear, nose and throat: No hearing loss, ear pain, nasal congestion, sore throat Cardiovascular: No chest pain, palpitations Respiratory: No shortness of breath at rest or with exertion.   No wheezes GastrointestinaI: No nausea, vomiting, diarrhea, abdominal pain, fecal incontinence Genitourinary: No dysuria, urinary retention or frequency.  No nocturia. Musculoskeletal: No neck pain, back pain Integumentary: No rash, pruritus, skin lesions Neurological: as above Psychiatric: No depression at this time.  No anxiety Endocrine: No palpitations, diaphoresis, change in appetite, change in weigh or increased thirst Hematologic/Lymphatic: No anemia, purpura, petechiae. Allergic/Immunologic: No itchy/runny eyes, nasal congestion, recent allergic reactions, rashes  ALLERGIES: Allergies  Allergen Reactions  . Shellfish Allergy Anaphylaxis    HOME MEDICATIONS:  Current Outpatient Prescriptions:  .  cholecalciferol (VITAMIN D) 1000 units tablet, Take 14,000 Units by mouth daily., Disp: , Rfl:  .  HYDROcodone-acetaminophen (NORCO) 5-325 MG per tablet, Take 1-2 tablets by mouth every 6 (six) hours as needed for moderate pain. (Patient not taking: Reported on 06/19/2016), Disp: 30 tablet, Rfl: 0 .  HYDROcodone-acetaminophen (NORCO) 5-325 MG per tablet, Take 1-2 tablets by mouth every 6 (six) hours as needed for moderate pain. (Patient not taking: Reported on 06/19/2016), Disp: 30 tablet, Rfl: 0 .  ibuprofen (ADVIL,MOTRIN) 600 MG tablet, 1 po every  8 hours  PRN (Patient not taking: Reported on 06/19/2016), Disp: 30 tablet, Rfl: 1 .  methylergonovine  (METHERGINE) 0.2 MG tablet, Take 1 tablet (0.2 mg total) by mouth every 6 (six) hours. (Patient not taking: Reported on 06/19/2016), Disp: 12 tablet, Rfl: 0  PAST MEDICAL HISTORY: Past Medical History:  Diagnosis Date  . Anxiety   . Dysmenorrhea   . Multiple sclerosis (HCC)   . Seizures (HCC)   . Vision abnormalities     PAST SURGICAL HISTORY: Past Surgical History:  Procedure Laterality Date  . DILATION AND EVACUATION N/A 12/18/2013   Procedure: DILATATION AND EVACUATION Ultrasound guided;  Surgeon: Dorien Chihuahua. Richardson Dopp, MD;  Location: [facility];  Service: Gynecology;  Laterality: N/A;  . TONSILLECTOMY      FAMILY HISTORY: Family History  Problem Relation Age of Onset  . Liver cancer Mother   . Heart disease Father   . AAA (abdominal aortic aneurysm) Father   . Hypertension Father   . High Cholesterol Father   . Atrial fibrillation Father   .  Healthy Brother     SOCIAL HISTORY:  Social History   Social History  . Marital status: Married    Spouse name: N/A  . Number of children: N/A  . Years of education: N/A   Occupational History  . Not on file.   Social History Main Topics  . Smoking status: Current Every Day Smoker  . Smokeless tobacco: Never Used  . Alcohol use Yes     Comment: seldom  . Drug use: No  . Sexual activity: Yes   Other Topics Concern  . Not on file   Social History Narrative  . No narrative on file     PHYSICAL EXAM  Vitals:   06/19/16 0859  BP: 133/89  Pulse: 72  Resp: 16  Weight: 176 lb (79.8 kg)  Height: 5\' 2"  (1.575 m)    Body mass index is 32.19 kg/m.   General: The patient is well-developed and well-nourished and in no acute distress  Eyes:  Funduscopic exam shows normal optic discs and retinal vessels.  Neck: The neck is supple, no carotid bruits are noted.  The neck is nontender.  Cardiovascular: The heart has a regular rate and rhythm with a normal S1 and S2. There were no murmurs, gallops or rubs. Lungs are clear to  auscultation.  Skin: Extremities are without significant edema.  Musculoskeletal:  Back is nontender  Neurologic Exam  Mental status: The patient is alert and oriented x 3 at the time of the examination. The patient has apparent normal recent and remote memory, with an apparently normal attention span and concentration ability.   Speech is normal.  Cranial nerves: Extraocular movements are full. Pupils are equal, round, and reactive to light and accomodation.  Color vision is normal. Visual fields are full.  Facial symmetry is present. There is good facial sensation to soft touch bilaterally.Facial strength is normal.  Trapezius and sternocleidomastoid strength is normal. No dysarthria is noted.  The tongue is midline, and the patient has symmetric elevation of the soft palate. No obvious hearing deficits are noted.  Motor:  Muscle bulk is normal.   Tone is normal. Strength is  5 / 5 in all 4 extremities.   Sensory: Sensory testing is intact to pinprick, soft touch and vibration sensation in all 4 extremities.  Coordination: Cerebellar testing reveals good finger-nose-finger and heel-to-shin bilaterally.  Gait and station: Station is normal.   Gait is normal. Tandem gait is normal. Romberg is negative.   Reflexes: Deep tendon reflexes are symmetric.  Knee reflexes are increased with spread. Ankle reflexes are increased but there is no clonus..   Plantar responses are flexor.  Other:  She has Tinel signs at both wrists, left greater than right.    DIAGNOSTIC DATA (LABS, IMAGING, TESTING) - I reviewed patient records, labs, notes, testing and imaging myself where available.  Lab Results  Component Value Date   WBC 23.8 (H) 12/18/2013   HGB 10.4 (L) 12/18/2013   HCT 30.1 (L) 12/18/2013   MCV 88.0 12/18/2013   PLT 265 12/18/2013      Component Value Date/Time   GLUCOSE 88 06/08/2011 0928    Lab Results  Component Value Date   TSH 2.264 04/16/2011       ASSESSMENT AND  PLAN  Multiple sclerosis (HCC)  Other fatigue - Plan: VITAMIN D 25 Hydroxy (Vit-D Deficiency, Fractures), TSH  Numbness  Low vitamin D level - Plan: VITAMIN D 25 Hydroxy (Vit-D Deficiency, Fractures)  Optic neuritis, left  History of  seizures   In summary, Dionte Preece is a 36 year old woman who was diagnosed with MS about a year and a half ago after presenting with left optic neuritis. Although she has had no definite exacerbations since her diagnosis, the MRI of the brain shows that there are 6 or 7 new T2/FLAIR hyperintense foci in the periventricular, deep or subcortical white matter. Additionally, 2 foci are enhancing implying a more recent event. She has been off of any disease modifying therapies as she desired to get pregnant. She did have a miscarriage last year. She and her husband are going to continue to try to have another pregnancy and she is undergoing some evaluation for low fertility.   Due to her desire to conceive within the next year, I feel her best disease modifying therapeutic option is Copaxone. I will prescribe the 40 mg 3 times a week dose she wore interested in long-term in one of the oral agents and if she does conceive and deliver we can always consider a change in therapy at that time.  If she breaks through for efficacy reasons, we may also want to consider one of the IV options.    She does have fatigue and I would consider an agent to help with that if this worsens. Mood issues are mild and she does not need a medical treatment at this time. Due to her history of seizures and her fatigue, she is advised to maximize her sleep on a nightly basis.  She will return to see me in 4 months but sooner if there are new or worsening neurologic symptoms.  Thank you for asking me to see Rindy at the MS center at [facility].   Please let me know if I can be of further assistance with her or other patients in the future.   Candela Krul A. Epimenio Foot, MD, PhD,  FAAN Certified in Neurology, Clinical Neurophysiology, Sleep Medicine, Pain Medicine and Neuroimaging Director, Multiple Sclerosis Center at [facility]  [facility] [address], [address] [place], [place] [address] [phone]  06/19/16

## 2016-06-20 ENCOUNTER — Telehealth: Payer: Self-pay | Admitting: *Deleted

## 2016-06-20 LAB — TSH: TSH: 2.72 u[IU]/mL (ref 0.450–4.500)

## 2016-06-20 LAB — VITAMIN D 25 HYDROXY (VIT D DEFICIENCY, FRACTURES): Vit D, 25-Hydroxy: 60.3 ng/mL (ref 30.0–100.0)

## 2016-06-20 NOTE — Telephone Encounter (Signed)
I have spoken with Chelsea Gentry this morning and per [facility], advised that lab work done in our office is ok.  She verbalized understanding of same/fim

## 2016-06-20 NOTE — Telephone Encounter (Signed)
-----   Message from Asa Lente, MD sent at 06/20/2016  9:44 AM EDT ----- Please let the patient know that the lab work is fine.    (Vit D and TSH)

## 2016-06-21 ENCOUNTER — Encounter: Payer: Self-pay | Admitting: Neurology

## 2016-06-25 ENCOUNTER — Telehealth: Payer: Self-pay | Admitting: Neurology

## 2016-06-25 NOTE — Telephone Encounter (Signed)
Copaxone PA completed and faxed to [facility].  She  has not tried and failed any other dmt's./fim

## 2016-06-25 NOTE — Telephone Encounter (Signed)
[employer] [id number] opt 2 Glatiramer Acetate (COPAXONE) 40 MG/ML SOSY needs PA.

## 2016-06-26 NOTE — Telephone Encounter (Signed)
Fax received from [employer] [facility] [place] (phone # [phone]).  Copaxone PA approved for dates 06/25/16 thru 01/06/38.  ref# [id number]/fim

## 2016-06-27 ENCOUNTER — Telehealth: Payer: Self-pay | Admitting: *Deleted

## 2016-06-27 MED ORDER — GLATIRAMER ACETATE 40 MG/ML ~~LOC~~ SOSY: 40.0000 mg | PREFILLED_SYRINGE | SUBCUTANEOUS | 3 refills | Status: DC

## 2016-06-27 NOTE — Telephone Encounter (Signed)
I have spoken with Chelsea Gentry this morning.  She sts. [employer] advised her that Copaxone rx. was forwarded to [facility], phone# [phone], but when she called the pharmacy, they told her they have not received a rx. for her.  I have escribed Copaxone to [facility] now, and she will let me know if she has further problems/fim

## 2016-07-11 NOTE — Telephone Encounter (Signed)
Pt calling stating she needs a new home nurse because she was sent the generic for Copaxone  She needs help in using it, please call

## 2016-07-11 NOTE — Telephone Encounter (Signed)
I have spoken with Chelsea Gentry.  Copaxone was ordered for her--insurance approved. Glatiramer.  The autoinjector is different with Glatiramer than it is with Copaxone.  Glatiramer srf completed, with order for inj. training, and faxed to [facility] fax# [phone]/fim

## 2016-10-25 ENCOUNTER — Telehealth: Payer: Self-pay

## 2016-10-25 NOTE — Telephone Encounter (Signed)
Spoke to patient - she usually takes her Copaxone on M, W, F but this week took it M, W, Th by mistake.  She will resume her normal injection regimen next week.

## 2016-10-25 NOTE — Telephone Encounter (Signed)
Patient called stating that she messed up her medication, copaxone, and would like to discuss what she needs to do. Please call her, thanks.

## 2016-10-30 ENCOUNTER — Ambulatory Visit (INDEPENDENT_AMBULATORY_CARE_PROVIDER_SITE_OTHER): Payer: BLUE CROSS/BLUE SHIELD | Admitting: Neurology

## 2016-10-30 ENCOUNTER — Encounter: Payer: Self-pay | Admitting: Neurology

## 2016-10-30 VITALS — BP 142/96 | HR 108 | Resp 16 | Ht 62.0 in | Wt 176.0 lb

## 2016-10-30 DIAGNOSIS — G35 Multiple sclerosis: Secondary | ICD-10-CM

## 2016-10-30 DIAGNOSIS — R200 Anesthesia of skin: Secondary | ICD-10-CM

## 2016-10-30 DIAGNOSIS — H469 Unspecified optic neuritis: Secondary | ICD-10-CM | POA: Diagnosis not present

## 2016-10-30 DIAGNOSIS — R5383 Other fatigue: Secondary | ICD-10-CM

## 2016-10-30 MED ORDER — AMPHETAMINE-DEXTROAMPHET ER 15 MG PO CP24: 15.0000 mg | ORAL_CAPSULE | ORAL | 0 refills | Status: DC

## 2016-10-30 MED ORDER — SERTRALINE HCL 50 MG PO TABS: 50.0000 mg | ORAL_TABLET | Freq: Every day | ORAL | 11 refills | Status: DC

## 2016-10-30 NOTE — Progress Notes (Signed)
[facility]  PATIENT: Chelsea Gentry DOB: 06/05/1980  REFERRING DOCTOR OR PCP:  Referred by Dr. Hyacinth MeekerMiller ([facility]);   Henreitta LeberElmira Powell, PA-C ([facility]) SOURCE: patient, notes from Dr. Hyacinth MeekerMiller, labs and imaging reports since 2016,   _________________________________   HISTORICAL  CHIEF COMPLAINT:  Chief Complaint  Patient presents with  . Multiple Sclerosis    Sts. she is tolerating Glatiramer well. C/O difficulty with fatigue, focus, anxiety, mild depression.  She is still trying to get pregnant--will be seeing a fertility specialist soon/fim    HISTORY OF PRESENT ILLNESS:   Chelsea NorlanderMegan Gentry is a 36 y.o. woman with multiple sclerosis and history of seizures as an adolescent.    Update 10/30/2016: She appears her MS is stable and she has not had any more exacerbations. She takes glatiramer (generic Copaxone) and is tolerating it well.      She feels her gait, strength and sensation are unchanged.   She is still getting some tingling in her hands, especially in the mornings and when she has arm raised over her head.    She notes tingling at times in her feet.  Vision is unchanged with occasional left blurring.  Bladder is sometimes a problems with occasioanl stress incontinence with cough/sneeze/laugh.     She has fatigue.   This is both physical and mental.     She sleeps well at night.    She has difficulty with focus and attention.   She also notes decreased motivation which has worsened this year.    She notes some depression which was worse this summer.       From 06/19/2016: She was diagnosed with MS in October 2016. In retrospect, about 6 or 7 years ago she had numbness in her hands more than her feet that was moderate for several weeks before it improved. At that time, she was found to have a low vitamin D and that was blamed for the incident.  In late September 2016, she presented with left visual blurring.    She had an MRI of the brain consistent with MS.    She received po steroids and did better    Her headaches associated with the ON also improved.    She was contemplating pregnancy so did not start any DMT.     She does not think she has had any definite exacerbation over the past 1 1/2 years since diagnosis.   However, she had a few episodes of severe exhaustion/faigue lasting 1/2 day.   She had a miscarriage last year  And is still hoping to get pregnant.     Currently, she denies difficulty with her gait or with strength.   She still notes tingling in her hands more than her feet.    She often wakes up with mild numbness in her hands but not pain.   This does not wake her up during the night.   Raising her arm over her hand sometimes causes more numbness.     She notes that she will gets some stress incontinence if she coughs or sneezes. Otherwise she does not note much difficulty with urinary frequency, urgency or hesitancy.    She does get some urinary tract infections.  She felt vision got back to baseline.   However, she notes that she will get eye strain easily in that eye.   She deneis any change in color vision.     She reports some fatigue, especially in the heat. She usually  sleeps well at night. She tries to make sure to get plenty of sleep may have triggered some seizures in the past.  She denies any significant depression or anxiety. She has been moody at times. She denies any significant problems with cognition.  She had 5 seizures over 3-4 years around ages [age] but none the past 14-15 years.   Poor sleep may have triggered some of the seizures.   One occurred after taking a diet energy pill.      Her Vit D was low twice in the past, first teste about 6-7 years ago.   She takes 14-21000 U vit D most days.  I personally reviewed the MRIs of the brain from 10/11/2014, 10/13/2014 and 05/20/2016. The MRIs from October 2016 showed enhancement of the left optic nerve consistent with optic neuritis. Additionally, there were multiple  T2/FLAIR hyperintense foci in the periventricular, juxtacortical and deep white matter and also one small one in the right cerebellar hemisphere. These were consistent with a diagnosis of multiple sclerosis. The MRI from 05/20/2016 shows the chronic lesions and additionally shows at least 6 new lesions in the periventricular, deep and juxtacortical white matter with 2 enhancing lesions.    REVIEW OF SYSTEMS: Constitutional: No fevers, chills, sweats, or change in appetite.   Some fatigue Eyes: No visual changes, double vision, eye pain Ear, nose and throat: No hearing loss, ear pain, nasal congestion, sore throat Cardiovascular: No chest pain, palpitations Respiratory: No shortness of breath at rest or with exertion.   No wheezes GastrointestinaI: No nausea, vomiting, diarrhea, abdominal pain, fecal incontinence Genitourinary: No dysuria, urinary retention or frequency.  No nocturia. Musculoskeletal: No neck pain, back pain Integumentary: No rash, pruritus, skin lesions Neurological: as above Psychiatric: No depression at this time.  No anxiety Endocrine: No palpitations, diaphoresis, change in appetite, change in weigh or increased thirst Hematologic/Lymphatic: No anemia, purpura, petechiae. Allergic/Immunologic: No itchy/runny eyes, nasal congestion, recent allergic reactions, rashes  ALLERGIES: Allergies  Allergen Reactions  . Shellfish Allergy Anaphylaxis    HOME MEDICATIONS:  Current Outpatient Prescriptions:  .  cholecalciferol (VITAMIN D) 1000 units tablet, Take 14,000 Units by mouth daily., Disp: , Rfl:  .  Glatiramer Acetate (COPAXONE) 40 MG/ML SOSY, Inject 40 mg into the skin 3 (three) times a week., Disp: 36 Syringe, Rfl: 3 .  metFORMIN (GLUCOPHAGE-XR) 500 MG 24 hr tablet, Take by mouth., Disp: , Rfl:   PAST MEDICAL HISTORY: Past Medical History:  Diagnosis Date  . Anxiety   . Dysmenorrhea   . Multiple sclerosis (HCC)   . Seizures (HCC)   . Vision abnormalities       PAST SURGICAL HISTORY: Past Surgical History:  Procedure Laterality Date  . DILATION AND EVACUATION N/A 12/18/2013   Procedure: DILATATION AND EVACUATION Ultrasound guided;  Surgeon: Dorien Chihuahua. Richardson Dopp, MD;  Location: [facility];  Service: Gynecology;  Laterality: N/A;  . TONSILLECTOMY      FAMILY HISTORY: Family History  Problem Relation Age of Onset  . Liver cancer Mother   . Heart disease Father   . AAA (abdominal aortic aneurysm) Father   . Hypertension Father   . High Cholesterol Father   . Atrial fibrillation Father   . Healthy Brother     SOCIAL HISTORY:  Social History   Social History  . Marital status: Married    Spouse name: N/A  . Number of children: N/A  . Years of education: N/A   Occupational History  . Not on file.   Social  History Main Topics  . Smoking status: Current Every Day Smoker  . Smokeless tobacco: Never Used  . Alcohol use Yes     Comment: seldom  . Drug use: No  . Sexual activity: Yes   Other Topics Concern  . Not on file   Social History Narrative  . No narrative on file     PHYSICAL EXAM  Vitals:   10/30/16 0822  BP: (!) 142/96  Pulse: (!) 108  Resp: 16  Weight: 176 lb (79.8 kg)  Height: 5\' 2"  (1.575 m)    Body mass index is 32.19 kg/m.   General: The patient is well-developed and well-nourished and in no acute distress   Neurologic Exam  Mental status: The patient is alert and oriented x 3 at the time of the examination. The patient has apparent normal recent and remote memory, with an apparently normal attention span and concentration ability.   Speech is normal.  Cranial nerves: Extraocular movements are full. Color vision is normal. Facial strength and sensation is normal. Trapezius strength is normal. No dysarthria is noted.  The tongue is midline, and the patient has symmetric elevation of the soft palate. No obvious hearing deficits are noted.  Motor:  Muscle bulk is normal.   Tone is normal. Strength is  5 / 5  in all 4 extremities.   Sensory: Sensory testing is intact to pinprick, soft touch and vibration sensation in all 4 extremities.  Coordination: Finger-nose-finger and heel-to-shin is performed well..  Gait and station: Station is normal.   Gait is normal. Tandem gait is normal. Romberg is negative.   Reflexes: Deep tendon reflexes are symmetric.  Knee reflexes are increased with spread. Mildly increased ankle reflexes.  Other:  She has bilateral Tinel signs at the wrists, a little more on the left.Marland Kitchen    DIAGNOSTIC DATA (LABS, IMAGING, TESTING) - I reviewed patient records, labs, notes, testing and imaging myself where available.  Lab Results  Component Value Date   WBC 23.8 (H) 12/18/2013   HGB 10.4 (L) 12/18/2013   HCT 30.1 (L) 12/18/2013   MCV 88.0 12/18/2013   PLT 265 12/18/2013      Component Value Date/Time   GLUCOSE 88 06/08/2011 0928    Lab Results  Component Value Date   TSH 2.720 06/19/2016       ASSESSMENT AND PLAN  Multiple sclerosis (HCC)  Optic neuritis, left  Other fatigue  Numbness  1.    Continue glatiramer 40 mg tiw. 2.    Continue vit D 3.   She has attention deficit disorder and also has a mild depression. I will have her start Adderall X or 15 mg in the morning and Zoloft daily. 4.    Return to see me in 6 months or call sooner if there are new or worsening neurologic symptoms    Clayborn Milnes A. Epimenio Foot, MD, PhD, FAAN Certified in Neurology, Clinical Neurophysiology, Sleep Medicine, Pain Medicine and Neuroimaging Director, [facility] at [facility]  [facility] [address] [place], [place] [address] [phone]  10/30/16

## 2016-11-27 ENCOUNTER — Telehealth: Payer: Self-pay | Admitting: Neurology

## 2016-11-27 MED ORDER — AMPHETAMINE-DEXTROAMPHET ER 15 MG PO CP24: 15.0000 mg | ORAL_CAPSULE | ORAL | 0 refills | Status: DC

## 2016-11-27 NOTE — Addendum Note (Signed)
Addended by: Candis Schatz I on: 11/27/2016 02:35 PM   Modules accepted: Orders

## 2016-11-27 NOTE — Telephone Encounter (Signed)
Patient calling to discuss reaction she had to Glatiramer Acetate (COPAXONE) 40 MG/ML SOSY. She did not go into detail.

## 2016-11-27 NOTE — Telephone Encounter (Signed)
Spoke with [employer]. She has been on Copaxone since July.  Had one inj. rxn. this week--after inj., had . of nausea, arms and legs heavy, heart racing. Noted more bleeding at inj. site than usual.  Episode completely resolved,has not happened again. She will call back if rxn becomes more frequent or intolerable--so far has just happened once. She will be mindful of inj. sites, so as to avoid a vein./fim

## 2017-01-08 ENCOUNTER — Telehealth: Payer: Self-pay | Admitting: Neurology

## 2017-01-08 MED ORDER — AMPHETAMINE-DEXTROAMPHET ER 15 MG PO CP24: 15.0000 mg | ORAL_CAPSULE | ORAL | 0 refills | Status: DC

## 2017-01-08 NOTE — Addendum Note (Signed)
Addended by: Candis Schatz I on: 01/08/2017 11:40 AM   Modules accepted: Orders

## 2017-01-08 NOTE — Telephone Encounter (Signed)
Rx. up front [facility]/fim 

## 2017-01-08 NOTE — Telephone Encounter (Signed)
Rx. awaiting RAS sig/fim 

## 2017-01-08 NOTE — Telephone Encounter (Signed)
Pt calling for a refill of amphetamine-dextroamphetamine (ADDERALL XR) 15 MG 24 hr capsule, (Pt stated there has been no changes in her insurance)pt is also requesting a call from RN Faith concerning another problem she is having

## 2017-02-27 ENCOUNTER — Telehealth: Payer: Self-pay | Admitting: Neurology

## 2017-02-27 MED ORDER — AMPHETAMINE-DEXTROAMPHET ER 15 MG PO CP24: 15.0000 mg | ORAL_CAPSULE | ORAL | 0 refills | Status: DC

## 2017-02-27 NOTE — Addendum Note (Signed)
Addended by: Candis Schatz I on: 02/27/2017 01:53 PM   Modules accepted: Orders

## 2017-02-27 NOTE — Telephone Encounter (Signed)
Pt calling for a written refill for amphetamine-dextroamphetamine (ADDERALL XR) 15 MG 24 hr capsule

## 2017-02-27 NOTE — Telephone Encounter (Signed)
Rx. up front [facility]/fim 

## 2017-02-27 NOTE — Telephone Encounter (Signed)
Rx. awaiting RAS sig/fim 

## 2017-04-30 ENCOUNTER — Ambulatory Visit (INDEPENDENT_AMBULATORY_CARE_PROVIDER_SITE_OTHER): Payer: BLUE CROSS/BLUE SHIELD | Admitting: Neurology

## 2017-04-30 ENCOUNTER — Encounter: Payer: Self-pay | Admitting: Neurology

## 2017-04-30 ENCOUNTER — Other Ambulatory Visit: Payer: Self-pay

## 2017-04-30 ENCOUNTER — Telehealth: Payer: Self-pay | Admitting: Neurology

## 2017-04-30 VITALS — BP 114/78 | HR 68 | Resp 16 | Ht 62.0 in | Wt 163.5 lb

## 2017-04-30 DIAGNOSIS — G35 Multiple sclerosis: Secondary | ICD-10-CM

## 2017-04-30 DIAGNOSIS — R200 Anesthesia of skin: Secondary | ICD-10-CM

## 2017-04-30 DIAGNOSIS — R5383 Other fatigue: Secondary | ICD-10-CM | POA: Diagnosis not present

## 2017-04-30 DIAGNOSIS — R7989 Other specified abnormal findings of blood chemistry: Secondary | ICD-10-CM | POA: Diagnosis not present

## 2017-04-30 MED ORDER — AMPHETAMINE-DEXTROAMPHET ER 15 MG PO CP24: 15.0000 mg | ORAL_CAPSULE | ORAL | 0 refills | Status: DC

## 2017-04-30 MED ORDER — ALPRAZOLAM 1 MG PO TABS: ORAL_TABLET | ORAL | 0 refills | Status: DC

## 2017-04-30 NOTE — Progress Notes (Signed)
[facility]  PATIENT: Chelsea Gentry DOB: 19-Apr-1980  REFERRING DOCTOR OR PCP:  Referred by Dr. Hyacinth Meeker ([facility]);   Henreitta Leber, PA-C ([facility]) SOURCE: patient, notes from Dr. Hyacinth Meeker, labs and imaging reports since 2016,   _________________________________   HISTORICAL  CHIEF COMPLAINT:  Chief Complaint  Patient presents with  . Multiple Sclerosis    Sts. she continues to tolerate Copaxone well. Sts. for the last wk, she has had intermittent sensation of bugs crawling on her right lower back. Needs a r/f of Adderall/fim    HISTORY OF PRESENT ILLNESS:   Chelsea Gentry is a 37 y.o. woman with multiple sclerosis and history of seizures as an adolescent.    Update 04/30/2017: She is doing well.   She is on Copaxone and tolerates it well.  Her last exacerbation was at diagnosis.   She tolrates the Copaxone injections well.   She gets a red mark x 3- days.     Her gait is normal.   Strength and sensation are generally ok but she gets a bug crawling sensation in the right hip and buttock area.    It is intermittent and aggravating but not painful.    Vision is doing well.    Bladder is fine.     She has physical and cognitive fatigue, helped by Adderall.  Mood has done well on zoloft  Occasionally, she gets a headaches.  Vitamin D has been low and she takes a supplement.   Update 10/30/2016: She appears her MS is stable and she has not had any more exacerbations. She takes glatiramer (generic Copaxone) and is tolerating it well.      She feels her gait, strength and sensation are unchanged.   She is still getting some tingling in her hands, especially in the mornings and when she has arm raised over her head.    She notes tingling at times in her feet.  Vision is unchanged with occasional left blurring.  Bladder is sometimes a problems with occasioanl stress incontinence with cough/sneeze/laugh.     She has fatigue.   This is both physical and mental.     She  sleeps well at night.    She has difficulty with focus and attention.   She also notes decreased motivation which has worsened this year.    She notes some depression which was worse this summer.  She takes extra vit D.      From 06/19/2016: She was diagnosed with MS in October 2016. In retrospect, about 6 or 7 years ago she had numbness in her hands more than her feet that was moderate for several weeks before it improved. At that time, she was found to have a low vitamin D and that was blamed for the incident.  In late September 2016, she presented with left visual blurring.    She had an MRI of the brain consistent with MS.   She received po steroids and did better    Her headaches associated with the ON also improved.    She was contemplating pregnancy so did not start any DMT.     She does not think she has had any definite exacerbation over the past 1 1/2 years since diagnosis.   However, she had a few episodes of severe exhaustion/faigue lasting 1/2 day.   She had a miscarriage last year  And is still hoping to get pregnant.     Currently, she denies difficulty with her gait or  with strength.   She still notes tingling in her hands more than her feet.    She often wakes up with mild numbness in her hands but not pain.   This does not wake her up during the night.   Raising her arm over her hand sometimes causes more numbness.     She notes that she will gets some stress incontinence if she coughs or sneezes. Otherwise she does not note much difficulty with urinary frequency, urgency or hesitancy.    She does get some urinary tract infections.  She felt vision got back to baseline.   However, she notes that she will get eye strain easily in that eye.   She deneis any change in color vision.     She reports some fatigue, especially in the heat. She usually sleeps well at night. She tries to make sure to get plenty of sleep may have triggered some seizures in the past.  She denies any significant  depression or anxiety. She has been moody at times. She denies any significant problems with cognition.  She had 5 seizures over 3-4 years around ages [age] but none the past 14-15 years.   Poor sleep may have triggered some of the seizures.   One occurred after taking a diet energy pill.      Her Vit D was low twice in the past, first teste about 6-7 years ago.   She takes 14-21000 U vit D most days.  I personally reviewed the MRIs of the brain from 10/11/2014, 10/13/2014 and 05/20/2016. The MRIs from October 2016 showed enhancement of the left optic nerve consistent with optic neuritis. Additionally, there were multiple T2/FLAIR hyperintense foci in the periventricular, juxtacortical and deep white matter and also one small one in the right cerebellar hemisphere. These were consistent with a diagnosis of multiple sclerosis. The MRI from 05/20/2016 shows the chronic lesions and additionally shows at least 6 new lesions in the periventricular, deep and juxtacortical white matter with 2 enhancing lesions.    REVIEW OF SYSTEMS: Constitutional: No fevers, chills, sweats, or change in appetite.   Some fatigue Eyes: No visual changes, double vision, eye pain Ear, nose and throat: No hearing loss, ear pain, nasal congestion, sore throat Cardiovascular: No chest pain, palpitations Respiratory: No shortness of breath at rest or with exertion.   No wheezes GastrointestinaI: No nausea, vomiting, diarrhea, abdominal pain, fecal incontinence Genitourinary: No dysuria, urinary retention or frequency.  No nocturia. Musculoskeletal: No neck pain, back pain Integumentary: No rash, pruritus, skin lesions Neurological: as above Psychiatric: No depression at this time.  No anxiety Endocrine: No palpitations, diaphoresis, change in appetite, change in weigh or increased thirst Hematologic/Lymphatic: No anemia, purpura, petechiae. Allergic/Immunologic: No itchy/runny eyes, nasal congestion, recent allergic  reactions, rashes  ALLERGIES: Allergies  Allergen Reactions  . Shellfish Allergy Anaphylaxis    HOME MEDICATIONS:  Current Outpatient Medications:  .  amphetamine-dextroamphetamine (ADDERALL XR) 15 MG 24 hr capsule, Take 1 capsule by mouth every morning., Disp: 30 capsule, Rfl: 0 .  cholecalciferol (VITAMIN D) 1000 units tablet, Take 14,000 Units by mouth daily., Disp: , Rfl:  .  Glatiramer Acetate (COPAXONE) 40 MG/ML SOSY, Inject 40 mg into the skin 3 (three) times a week., Disp: 36 Syringe, Rfl: 3 .  letrozole (FEMARA) 2.5 MG tablet, Take by mouth., Disp: , Rfl:  .  OVIDREL 250 MCG/0.5ML injection, INJECT AS DIRECTED BY DOCTOR, Disp: , Rfl: 0 .  sertraline (ZOLOFT) 50 MG tablet, Take 1 tablet (  50 mg total) by mouth daily., Disp: 30 tablet, Rfl: 11 .  ALPRAZolam (XANAX) 1 MG tablet, Take 2 to 3 before MRI, Disp: 3 tablet, Rfl: 0  PAST MEDICAL HISTORY: Past Medical History:  Diagnosis Date  . Anxiety   . Dysmenorrhea   . Multiple sclerosis (HCC)   . Seizures (HCC)   . Vision abnormalities     PAST SURGICAL HISTORY: Past Surgical History:  Procedure Laterality Date  . DILATION AND EVACUATION N/A 12/18/2013   Procedure: DILATATION AND EVACUATION Ultrasound guided;  Surgeon: Dorien Chihuahua. Richardson Dopp, MD;  Location: [facility];  Service: Gynecology;  Laterality: N/A;  . TONSILLECTOMY      FAMILY HISTORY: Family History  Problem Relation Age of Onset  . Liver cancer Mother   . Heart disease Father   . AAA (abdominal aortic aneurysm) Father   . Hypertension Father   . High Cholesterol Father   . Atrial fibrillation Father   . Healthy Brother     SOCIAL HISTORY:  Social History   Socioeconomic History  . Marital status: Married    Spouse name: Not on file  . Number of children: Not on file  . Years of education: Not on file  . Highest education level: Not on file  Occupational History  . Not on file  Social Needs  . Financial resource strain: Not on file  . Food insecurity:     Worry: Not on file    Inability: Not on file  . Transportation needs:    Medical: Not on file    Non-medical: Not on file  Tobacco Use  . Smoking status: Current Every Day Smoker  . Smokeless tobacco: Never Used  Substance and Sexual Activity  . Alcohol use: Yes    Comment: seldom  . Drug use: No  . Sexual activity: Yes  Lifestyle  . Physical activity:    Days per week: Not on file    Minutes per session: Not on file  . Stress: Not on file  Relationships  . Social connections:    Talks on phone: Not on file    Gets together: Not on file    Attends religious service: Not on file    Active member of club or organization: Not on file    Attends meetings of clubs or organizations: Not on file    Relationship status: Not on file  . Intimate partner violence:    Fear of current or ex partner: Not on file    Emotionally abused: Not on file    Physically abused: Not on file    Forced sexual activity: Not on file  Other Topics Concern  . Not on file  Social History Narrative  . Not on file     PHYSICAL EXAM  Vitals:   04/30/17 0949  BP: 114/78  Pulse: 68  Resp: 16  Weight: 163 lb 8 oz (74.2 kg)  Height: 5\' 2"  (1.575 m)    Body mass index is 29.9 kg/m.   General: The patient is well-developed and well-nourished and in no acute distress   Neurologic Exam  Mental status: The patient is alert and oriented x 3 at the time of the examination. The patient has apparent normal recent and remote memory, with an apparently normal attention span and concentration ability.   Speech is normal.  Cranial nerves: Extraocular movements are full.  Facial strength and sensation is normal.  Trapezius strength is normal.  Voice is clear.  The tongue is midline, and the  patient has symmetric elevation of the soft palate. No obvious hearing deficits are noted.  Motor:  Muscle bulk is normal.   Tone is normal. Strength is  5 / 5 in all 4 extremities.   Sensory: Sensory testing is intact  to pinprick, soft touch and vibration sensation in all 4 extremities.  Coordination: Finger-nose-finger and heel-to-shin is performed well..  Gait and station: Station is normal.  Gait and tandem gait are normal.  Romberg is negative.   Reflexes: Deep tendon reflexes are symmetric.  Knee reflexes are increased with spread. Mildly increased ankle reflexes.  There is no ankle clonus.      DIAGNOSTIC DATA (LABS, IMAGING, TESTING) - I reviewed patient records, labs, notes, testing and imaging myself where available.  Lab Results  Component Value Date   WBC 23.8 (H) 12/18/2013   HGB 10.4 (L) 12/18/2013   HCT 30.1 (L) 12/18/2013   MCV 88.0 12/18/2013   PLT 265 12/18/2013      Component Value Date/Time   GLUCOSE 88 06/08/2011 0928    Lab Results  Component Value Date   TSH 2.720 06/19/2016       ASSESSMENT AND PLAN  Multiple sclerosis (HCC) - Plan: MR BRAIN W WO CONTRAST, MR CERVICAL SPINE W WO CONTRAST  Other fatigue  Numbness  Low vitamin D level  1.    She will continue glatiramer 40 mg 3 times a week. 2.    Continue OTC vitamin D supplements. 3.    For her attention deficit disorder she will continue Adderall XR 15 mg.  She takes 4 or 5 days a week. 4.    Return to see me in 6 months or call sooner if there are new or worsening neurologic symptoms    Destin Kittler A. Epimenio Foot, MD, PhD, FAAN Certified in Neurology, Clinical Neurophysiology, Sleep Medicine, Pain Medicine and Neuroimaging Director, [facility] at [facility]  [facility] [address] [place], [place] [address] [phone]

## 2017-04-30 NOTE — Telephone Encounter (Signed)
04/30/17 [employer] Auth: [id number] (exp. 04/30/17 to 05/29/17) order sent to [facility]

## 2017-05-05 DIAGNOSIS — E559 Vitamin D deficiency, unspecified: Secondary | ICD-10-CM | POA: Insufficient documentation

## 2017-06-05 ENCOUNTER — Encounter: Payer: Self-pay | Admitting: Neurology

## 2017-06-05 ENCOUNTER — Other Ambulatory Visit: Payer: Self-pay | Admitting: Neurology

## 2017-06-05 ENCOUNTER — Other Ambulatory Visit: Payer: Self-pay | Admitting: *Deleted

## 2017-06-05 MED ORDER — AMPHETAMINE-DEXTROAMPHET ER 15 MG PO CP24: 15.0000 mg | ORAL_CAPSULE | ORAL | 0 refills | Status: DC

## 2017-06-05 MED ORDER — MELOXICAM 15 MG PO TABS: 15.0000 mg | ORAL_TABLET | Freq: Every day | ORAL | 0 refills | Status: DC

## 2017-06-05 NOTE — Telephone Encounter (Signed)
Pt request refill for amphetamine-dextroamphetamine (ADDERALL XR) 15 MG 24 hr capsule sent to [facility]/[address]

## 2017-06-19 ENCOUNTER — Other Ambulatory Visit: Payer: Self-pay | Admitting: Neurology

## 2017-07-09 ENCOUNTER — Other Ambulatory Visit: Payer: Self-pay | Admitting: Neurology

## 2017-07-09 MED ORDER — AMPHETAMINE-DEXTROAMPHET ER 15 MG PO CP24: 15.0000 mg | ORAL_CAPSULE | ORAL | 0 refills | Status: DC

## 2017-08-06 ENCOUNTER — Other Ambulatory Visit: Payer: Self-pay | Admitting: Neurology

## 2017-08-06 MED ORDER — AMPHETAMINE-DEXTROAMPHET ER 15 MG PO CP24: 15.0000 mg | ORAL_CAPSULE | ORAL | 0 refills | Status: DC

## 2017-08-06 NOTE — Telephone Encounter (Signed)
Rx registry checked. Last fill date is 07/09/17 for #30. Next OV is 11/10/17. Pharmacy confirmed.

## 2017-08-06 NOTE — Addendum Note (Signed)
Addended by: Rocky Link A on: 08/06/2017 10:10 AM   Modules accepted: Orders

## 2017-08-06 NOTE — Telephone Encounter (Signed)
Pt has called for a refill on her amphetamine-dextroamphetamine (ADDERALL XR) 15 MG 24 hr capsule Please send to [id number] - [place], [place] - [address] [phone] (Phone) [phone] (Fax)    Pt 6 mo f/u shceduled for 11-10-2017

## 2017-08-15 ENCOUNTER — Other Ambulatory Visit: Payer: Self-pay

## 2017-08-22 ENCOUNTER — Ambulatory Visit
Admission: RE | Admit: 2017-08-22 | Discharge: 2017-08-22 | Disposition: A | Discharge status: Home / Self Care | Payer: BLUE CROSS/BLUE SHIELD | Source: Ambulatory Visit | Attending: Neurology | Admitting: Neurology

## 2017-08-22 DIAGNOSIS — G35 Multiple sclerosis: Secondary | ICD-10-CM | POA: Diagnosis not present

## 2017-08-22 MED ORDER — GADOBENATE DIMEGLUMINE 529 MG/ML IV SOLN
15.0000 mL | Freq: Once | INTRAVENOUS | Status: AC | PRN
  Administered 2017-08-22: 15 mL via INTRAVENOUS

## 2017-08-26 ENCOUNTER — Telehealth: Payer: Self-pay | Admitting: *Deleted

## 2017-08-26 NOTE — Telephone Encounter (Signed)
LMOM with below MRI results; please call with any questions/fim

## 2017-08-26 NOTE — Telephone Encounter (Signed)
-----   Message from Asa Lente, MD sent at 08/25/2017  6:05 PM EDT ----- Please let her know that the MRI of the brain did not appear to show any new lesions compared to the MRI she did in May 2018 and she should continue on the Copaxone.  There are more lesions than they were on the 2016 MRI, however.  She has one plaque in the spinal cord.

## 2017-09-11 ENCOUNTER — Other Ambulatory Visit: Payer: Self-pay | Admitting: Neurology

## 2017-09-11 MED ORDER — AMPHETAMINE-DEXTROAMPHET ER 15 MG PO CP24: 15.0000 mg | ORAL_CAPSULE | ORAL | 0 refills | Status: DC

## 2017-09-11 NOTE — Telephone Encounter (Signed)
Pt request refill for amphetamine-dextroamphetamine (ADDERALL XR) 15 MG 24 hr capsule sent to [id number] - [place], [place] - [address]

## 2017-10-13 ENCOUNTER — Other Ambulatory Visit: Payer: Self-pay | Admitting: Neurology

## 2017-10-13 MED ORDER — AMPHETAMINE-DEXTROAMPHET ER 15 MG PO CP24: 15.0000 mg | ORAL_CAPSULE | ORAL | 0 refills | Status: DC

## 2017-10-13 NOTE — Addendum Note (Signed)
Addended by: Candis Schatz I on: 10/13/2017 10:58 AM   Modules accepted: Orders

## 2017-10-13 NOTE — Telephone Encounter (Signed)
Patient requesting refill of amphetamine-dextroamphetamine (ADDERALL XR) 15 MG 24 hr capsule sent to [facility] on [address] in [place].

## 2017-11-10 ENCOUNTER — Other Ambulatory Visit: Payer: Self-pay | Admitting: Neurology

## 2017-11-10 ENCOUNTER — Ambulatory Visit: Payer: BLUE CROSS/BLUE SHIELD | Admitting: Neurology

## 2017-11-10 ENCOUNTER — Encounter: Payer: Self-pay | Admitting: Neurology

## 2017-11-10 VITALS — BP 133/86 | HR 76 | Ht 62.0 in | Wt 154.5 lb

## 2017-11-10 DIAGNOSIS — R5383 Other fatigue: Secondary | ICD-10-CM

## 2017-11-10 DIAGNOSIS — R200 Anesthesia of skin: Secondary | ICD-10-CM | POA: Diagnosis not present

## 2017-11-10 DIAGNOSIS — G35 Multiple sclerosis: Secondary | ICD-10-CM

## 2017-11-10 DIAGNOSIS — R7989 Other specified abnormal findings of blood chemistry: Secondary | ICD-10-CM

## 2017-11-10 DIAGNOSIS — H469 Unspecified optic neuritis: Secondary | ICD-10-CM

## 2017-11-10 MED ORDER — AMPHETAMINE-DEXTROAMPHET ER 15 MG PO CP24: 15.0000 mg | ORAL_CAPSULE | ORAL | 0 refills | Status: DC

## 2017-11-10 NOTE — Progress Notes (Signed)
[facility]  PATIENT: Chelsea Gentry DOB: July 02, 1980  REFERRING DOCTOR OR PCP:  Referred by Dr. Hyacinth Meeker ([facility]);   Henreitta Leber, PA-C ([facility]) SOURCE: patient, notes from Dr. Hyacinth Meeker, labs and imaging reports since 2016,   _________________________________   HISTORICAL  CHIEF COMPLAINT:  Chief Complaint  Patient presents with  . Follow-up    RM 12, alone. Last seen 04/30/17. Denies any vision changes.   . Multiple Sclerosis    Takes copaxone. Having more vertigo episodes. Intermittent. Can last for up to a week and will go away. Also reports more leg pain/calf pain, bilaterally. This started this past summer. She feels this worsens around her period.     HISTORY OF PRESENT ILLNESS:   Chelsea Gentry is a 37 y.o. woman with multiple sclerosis and history of seizures as an adolescent.    Update 11/10/2017: Her MS is doing well and she has not had any exacerbations on Copaxone since dx in 2016 after right ON.    She is doing well with the Copaxone shots.  Skin reactions have all been mild.  I took another look at the MRI of the brain from 08/22/2017.  There are no new lesions compared to the May 2018 MRI though there are a couple foci that were not present on the 2016 MRI.  MRI of the cervical spine shows a single focus at C2-C3.   She feels gait and strength are doing well.  Her feet get numb a times, right mor ethan left.    She has some intermittent vertigo, often with changes in position.   Occasionally, walking has triggered it.      She has noted more leg pains, worse during her period.  Pain is crampy, helped by Advil.    She gets some HA's and nausea.    Adderall is helping fatigue and attention deficit.   She tolerates it well.      Update 04/30/2017: She is doing well.   She is on Copaxone and tolerates it well.  Her last exacerbation was at diagnosis.   She tolrates the Copaxone injections well.   She gets a red mark x 3- days.     Her gait is  normal.   Strength and sensation are generally ok but she gets a bug crawling sensation in the right hip and buttock area.    It is intermittent and aggravating but not painful.    Vision is doing well.    Bladder is fine.     She has physical and cognitive fatigue, helped by Adderall.  Mood has done well on zoloft  Occasionally, she gets a headaches.  Vitamin D has been low and she takes a supplement.   Update 10/30/2016: She appears her MS is stable and she has not had any more exacerbations. She takes glatiramer (generic Copaxone) and is tolerating it well.      She feels her gait, strength and sensation are unchanged.   She is still getting some tingling in her hands, especially in the mornings and when she has arm raised over her head.    She notes tingling at times in her feet.  Vision is unchanged with occasional left blurring.  Bladder is sometimes a problems with occasioanl stress incontinence with cough/sneeze/laugh.     She has fatigue.   This is both physical and mental.     She sleeps well at night.    She has difficulty with focus and attention.  She also notes decreased motivation which has worsened this year.    She notes some depression which was worse this summer.  She takes extra vit D.      From 06/19/2016: She was diagnosed with MS in October 2016. In retrospect, about 6 or 7 years ago she had numbness in her hands more than her feet that was moderate for several weeks before it improved. At that time, she was found to have a low vitamin D and that was blamed for the incident.  In late September 2016, she presented with left visual blurring.    She had an MRI of the brain consistent with MS.   She received po steroids and did better    Her headaches associated with the ON also improved.    She was contemplating pregnancy so did not start any DMT.     She does not think she has had any definite exacerbation over the past 1 1/2 years since diagnosis.   However, she had a few episodes  of severe exhaustion/faigue lasting 1/2 day.   She had a miscarriage last year  And is still hoping to get pregnant.     Currently, she denies difficulty with her gait or with strength.   She still notes tingling in her hands more than her feet.    She often wakes up with mild numbness in her hands but not pain.   This does not wake her up during the night.   Raising her arm over her hand sometimes causes more numbness.     She notes that she will gets some stress incontinence if she coughs or sneezes. Otherwise she does not note much difficulty with urinary frequency, urgency or hesitancy.    She does get some urinary tract infections.  She felt vision got back to baseline.   However, she notes that she will get eye strain easily in that eye.   She deneis any change in color vision.     She reports some fatigue, especially in the heat. She usually sleeps well at night. She tries to make sure to get plenty of sleep may have triggered some seizures in the past.  She denies any significant depression or anxiety. She has been moody at times. She denies any significant problems with cognition.  She had 5 seizures over 3-4 years around ages [age] but none the past 14-15 years.   Poor sleep may have triggered some of the seizures.   One occurred after taking a diet energy pill.      Her Vit D was low twice in the past, first teste about 6-7 years ago.   She takes 14-21000 U vit D most days.  I personally reviewed the MRIs of the brain from 10/11/2014, 10/13/2014 and 05/20/2016. The MRIs from October 2016 showed enhancement of the left optic nerve consistent with optic neuritis. Additionally, there were multiple T2/FLAIR hyperintense foci in the periventricular, juxtacortical and deep white matter and also one small one in the right cerebellar hemisphere. These were consistent with a diagnosis of multiple sclerosis. The MRI from 05/20/2016 shows the chronic lesions and additionally shows at least 6 new  lesions in the periventricular, deep and juxtacortical white matter with 2 enhancing lesions.    REVIEW OF SYSTEMS: Constitutional: No fevers, chills, sweats, or change in appetite.   Some fatigue Eyes: No visual changes, double vision, eye pain Ear, nose and throat: No hearing loss, ear pain, nasal congestion, sore throat Cardiovascular: No chest pain,  palpitations Respiratory: No shortness of breath at rest or with exertion.   No wheezes GastrointestinaI: No nausea, vomiting, diarrhea, abdominal pain, fecal incontinence Genitourinary: No dysuria, urinary retention or frequency.  No nocturia. Musculoskeletal: No neck pain, back pain Integumentary: No rash, pruritus, skin lesions Neurological: as above Psychiatric: No depression at this time.  No anxiety Endocrine: No palpitations, diaphoresis, change in appetite, change in weigh or increased thirst Hematologic/Lymphatic: No anemia, purpura, petechiae. Allergic/Immunologic: No itchy/runny eyes, nasal congestion, recent allergic reactions, rashes  ALLERGIES: Allergies  Allergen Reactions  . Shellfish Allergy Anaphylaxis    HOME MEDICATIONS:  Current Outpatient Medications:  .  amphetamine-dextroamphetamine (ADDERALL XR) 15 MG 24 hr capsule, Take 1 capsule by mouth every morning., Disp: 30 capsule, Rfl: 0 .  Cholecalciferol (VITAMIN D PO), Take 7,000 Units by mouth daily., Disp: , Rfl:  .  Glatiramer Acetate 40 MG/ML SOSY, INJECT CONTENTS OF ONE SYRINGE (40 MG) INTO THE SKIN 3 (THREE) TIMES A WEEK., Disp: 36 Syringe, Rfl: 3 .  sertraline (ZOLOFT) 50 MG tablet, Take 1 tablet (50 mg total) by mouth daily., Disp: 30 tablet, Rfl: 11  PAST MEDICAL HISTORY: Past Medical History:  Diagnosis Date  . Anxiety   . Dysmenorrhea   . Multiple sclerosis (HCC)   . Seizures (HCC)   . Vision abnormalities     PAST SURGICAL HISTORY: Past Surgical History:  Procedure Laterality Date  . DILATION AND EVACUATION N/A 12/18/2013   Procedure:  DILATATION AND EVACUATION Ultrasound guided;  Surgeon: Dorien Chihuahua. Richardson Dopp, MD;  Location: [facility];  Service: Gynecology;  Laterality: N/A;  . TONSILLECTOMY      FAMILY HISTORY: Family History  Problem Relation Age of Onset  . Liver cancer Mother   . Heart disease Father   . AAA (abdominal aortic aneurysm) Father   . Hypertension Father   . High Cholesterol Father   . Atrial fibrillation Father   . Healthy Brother     SOCIAL HISTORY:  Social History   Socioeconomic History  . Marital status: Married    Spouse name: Not on file  . Number of children: Not on file  . Years of education: Not on file  . Highest education level: Not on file  Occupational History  . Not on file  Social Needs  . Financial resource strain: Not on file  . Food insecurity:    Worry: Not on file    Inability: Not on file  . Transportation needs:    Medical: Not on file    Non-medical: Not on file  Tobacco Use  . Smoking status: Current Every Day Smoker  . Smokeless tobacco: Never Used  Substance and Sexual Activity  . Alcohol use: Yes    Comment: seldom  . Drug use: No  . Sexual activity: Yes  Lifestyle  . Physical activity:    Days per week: Not on file    Minutes per session: Not on file  . Stress: Not on file  Relationships  . Social connections:    Talks on phone: Not on file    Gets together: Not on file    Attends religious service: Not on file    Active member of club or organization: Not on file    Attends meetings of clubs or organizations: Not on file    Relationship status: Not on file  . Intimate partner violence:    Fear of current or ex partner: Not on file    Emotionally abused: Not on file  Physically abused: Not on file    Forced sexual activity: Not on file  Other Topics Concern  . Not on file  Social History Narrative  . Not on file     PHYSICAL EXAM  Vitals:   11/10/17 0949  BP: 133/86  Pulse: 76  Weight: 154 lb 8 oz (70.1 kg)  Height: 5\' 2"  (1.575 m)     Body mass index is 28.26 kg/m.   General: The patient is well-developed and well-nourished and in no acute distress   Neurologic Exam  Mental status: The patient is alert and oriented x 3 at the time of the examination. The patient has apparent normal recent and remote memory, with an apparently normal attention span and concentration ability.   Speech is normal.  Cranial nerves: Extraocular movements are full.  Facial strength and sensation was normal.  Trapezius strength was normal.    No obvious hearing deficits are noted.  Motor:  Muscle bulk is normal.   Tone is normal. Strength is  5 / 5 in all 4 extremities.   Sensory: She had intact sensation to touch and vibration in the arms and legs.  Coordination: Finger-nose-finger and heel-to-shin is performed well..  Gait and station: Station is normal.  Gait is normal.  Tandem gait is mildly wide..  Romberg is negative.   Reflexes: Deep tendon reflexes are symmetric.  Knee reflexes are increased with spread. Mildly increased ankle reflexes.  There is no ankle clonus.      DIAGNOSTIC DATA (LABS, IMAGING, TESTING) - I reviewed patient records, labs, notes, testing and imaging myself where available.  Lab Results  Component Value Date   WBC 23.8 (H) 12/18/2013   HGB 10.4 (L) 12/18/2013   HCT 30.1 (L) 12/18/2013   MCV 88.0 12/18/2013   PLT 265 12/18/2013      Component Value Date/Time   GLUCOSE 88 06/08/2011 0928    Lab Results  Component Value Date   TSH 2.720 06/19/2016       ASSESSMENT AND PLAN  Multiple sclerosis (HCC)  Other fatigue  Numbness  Low vitamin D level  Optic neuritis, left  1.    Her MS appears to be stable on Copaxone 40 mg 3 times a week.  She will continue. 2.    Continue the vitamin D over-the-counter supplements. 3.    Continue Adderall XR 15 mg for attention deficit disorder and fatigue.   4.    Return to see me in 6 months or call sooner if there are new or worsening neurologic  symptoms    Ladonne Sharples A. Epimenio Foot, MD, PhD, FAAN Certified in Neurology, Clinical Neurophysiology, Sleep Medicine, Pain Medicine and Neuroimaging Director, [facility]  [facility] [address], [address] [place], [place] [address] [phone]

## 2017-11-14 ENCOUNTER — Telehealth: Payer: Self-pay | Admitting: *Deleted

## 2017-11-14 NOTE — Telephone Encounter (Signed)
I called pt. Advised we received notification from [facility] that they were trying to reach her to schedule shipment for glatiramer acetate. She states she ordered online but will call them to f/u to make sure they do not need anything further.

## 2017-12-16 ENCOUNTER — Other Ambulatory Visit: Payer: Self-pay

## 2017-12-16 MED ORDER — AMPHETAMINE-DEXTROAMPHET ER 15 MG PO CP24: 15.0000 mg | ORAL_CAPSULE | ORAL | 0 refills | Status: DC

## 2018-01-14 ENCOUNTER — Other Ambulatory Visit: Payer: Self-pay

## 2018-01-15 MED ORDER — AMPHETAMINE-DEXTROAMPHET ER 15 MG PO CP24: 15.0000 mg | ORAL_CAPSULE | ORAL | 0 refills | Status: DC

## 2018-02-16 ENCOUNTER — Other Ambulatory Visit: Payer: Self-pay | Admitting: Neurology

## 2018-02-16 MED ORDER — AMPHETAMINE-DEXTROAMPHET ER 15 MG PO CP24: 15.0000 mg | ORAL_CAPSULE | ORAL | 0 refills | Status: DC

## 2018-02-16 NOTE — Telephone Encounter (Signed)
Pt is needing a refill for her amphetamine-dextroamphetamine (ADDERALL XR) 15 MG 24 hr capsule sent to [facility] on [address]

## 2018-03-27 ENCOUNTER — Other Ambulatory Visit: Payer: Self-pay

## 2018-03-30 MED ORDER — AMPHETAMINE-DEXTROAMPHET ER 15 MG PO CP24
15.0000 mg | ORAL_CAPSULE | ORAL | 0 refills | Status: DC
Start: 2018-03-30 — End: 2018-05-07

## 2018-05-07 ENCOUNTER — Other Ambulatory Visit: Payer: Self-pay | Admitting: *Deleted

## 2018-05-07 MED ORDER — AMPHETAMINE-DEXTROAMPHET ER 15 MG PO CP24: 15.0000 mg | ORAL_CAPSULE | ORAL | 0 refills | Status: DC

## 2018-05-07 NOTE — Telephone Encounter (Signed)
Called pt. Updated medication list/pharamcy/allgeries on file for [facility] 05/11/18. She confirmed she received email for visit.  She needs refill on adderall prior to f/u. Advised I will send request to Dr. Epimenio Foot.

## 2018-05-11 ENCOUNTER — Other Ambulatory Visit: Payer: Self-pay

## 2018-05-11 ENCOUNTER — Ambulatory Visit (INDEPENDENT_AMBULATORY_CARE_PROVIDER_SITE_OTHER): Payer: BLUE CROSS/BLUE SHIELD | Admitting: Neurology

## 2018-05-11 ENCOUNTER — Encounter: Payer: Self-pay | Admitting: Neurology

## 2018-05-11 DIAGNOSIS — R5383 Other fatigue: Secondary | ICD-10-CM | POA: Diagnosis not present

## 2018-05-11 DIAGNOSIS — Z87898 Personal history of other specified conditions: Secondary | ICD-10-CM | POA: Diagnosis not present

## 2018-05-11 DIAGNOSIS — G35 Multiple sclerosis: Secondary | ICD-10-CM | POA: Diagnosis not present

## 2018-05-11 DIAGNOSIS — G43009 Migraine without aura, not intractable, without status migrainosus: Secondary | ICD-10-CM

## 2018-05-11 DIAGNOSIS — H469 Unspecified optic neuritis: Secondary | ICD-10-CM

## 2018-05-11 DIAGNOSIS — R7989 Other specified abnormal findings of blood chemistry: Secondary | ICD-10-CM

## 2018-05-11 DIAGNOSIS — F329 Major depressive disorder, single episode, unspecified: Secondary | ICD-10-CM | POA: Insufficient documentation

## 2018-05-11 DIAGNOSIS — F32A Depression, unspecified: Secondary | ICD-10-CM

## 2018-05-11 MED ORDER — SUMATRIPTAN SUCCINATE 100 MG PO TABS: ORAL_TABLET | ORAL | 5 refills | Status: DC

## 2018-05-11 NOTE — Progress Notes (Signed)
[facility]  PATIENT: Chelsea Gentry DOB: 1980-06-29  REFERRING DOCTOR OR PCP:  Referred by Dr. Hyacinth Meeker ([facility]);   Henreitta Leber, PA-C ([facility]) SOURCE: patient, notes from Dr. Hyacinth Meeker, labs and imaging reports since 2016,   _________________________________   HISTORICAL  CHIEF COMPLAINT:  Chief Complaint  Patient presents with   Multiple Sclerosis    On Copaxone   Fatigue    On Adderall    HISTORY OF PRESENT ILLNESS:   Chelsea Gentry is a 38 y.o. woman with multiple sclerosis and history of seizures as an adolescent.   Update 05/11/2018: Virtual Visit via Video Note I connected with Chelsea Gentry on 05/11/18 at 11:00 AM EDT by a video enabled telemedicine application and verified that I am speaking with the correct person.  I discussed the limitations of evaluation and management by telemedicine and the availability of in person appointments. The patient expressed understanding and agreed to proceed.  History of Present Illness: She feels her MS is stable.  She denies any exacerbations or new symptoms.  She has been on Copaxone since 2018.  She was diagnosed with MS October 2016 but due to a desire for a pregnancy she did not start a disease modifying therapy.  In 2018 MRI showed 6 enhancing lesions and she did start Copaxone.  She tolerates it well with just very minimal skin injection site reactions that are not troublesome.  No systemic side effects.  Her last MRI performed 08/22/2017 did not show any new lesions.  She also has one focus in the cervical spine to the right at C2-C3.  Gait is doing well.  Strength and sensation are fine.  Balance is fine.  She does not have any vertigo.  Bladder function is fine.Her vision returned to baseline.   Attention deficit is helped by Adderall.  Fatigue is also better later in the day with the Adderall.    She has had a little bit of irritability at times and thinks the Adderall may make it worse.  She is on  Zoloft for depression and feels her mood has done better.  Vertigo is better.   She notes some migraine headaches, especially around her menstrual cycle for a week each month.  She gets nausea, photophobia and phonophobia.  Moving worsens the headache.  Ibuprofen and Excedrin help a little bit.      Observations/Objective:   She is a well-developed well-nourished woman in no acute distress.  The head is normocephalic and atraumatic.  Sclera are anicteric.  Visible skin appears normal.  The neck has a good range of motion.  Pharynx and tongue have normal appearance.  She is alert and fully oriented with fluent speech and good attention, knowledge and memory.  Extraocular muscles are intact.  Facial strength is normal.  Palatal elevation and tongue protrusion are midline.  She appears to have normal strength in the arms.  Rapid alternating movements and finger-nose-finger are performed well.   Assessment and Plan: Multiple sclerosis (HCC)  Optic neuritis, left  Other fatigue  History of seizures  Low vitamin D level  Depression, unspecified depression type  Migraine without aura and without status migrainosus, not intractable   1.   Continue Copaxone as a disease modifying therapy for her MS.  Later this year we will check another MRI to determine if she is having any subclinical progression.  If present, consider a different DMT. 2.   Stay active and exercise as tolerated.  Continue vitamin D.  We discussed continuing to exercise caution with the coronavirus using CDC guidelines. 3.   Adderall for attention deficit and fatigue 4.   Imitrex as needed migraine.  If the frequency of the headaches worsens, consider a prophylactic agent. 5.   She will return to see me in 6 months or sooner if there are new or worsening neurologic symptoms.  Follow Up Instructions: I discussed the assessment and treatment plan with the patient. The patient was provided an opportunity to ask questions and all  were answered. The patient agreed with the plan and demonstrated an understanding of the instructions.    The patient was advised to call back or seek an in-person evaluation if the symptoms worsen or if the condition fails to improve as anticipated.  I provided 25 minutes of non-face-to-face time during this encounter. ____________________________________________________  from previous visits: Update 11/10/2017: Her MS is doing well and she has not had any exacerbations on Copaxone since dx in 2016 after right ON.    She is doing well with the Copaxone shots.  Skin reactions have all been mild.  I took another look at the MRI of the brain from 08/22/2017.  There are no new lesions compared to the May 2018 MRI though there are a couple foci that were not present on the 2016 MRI.  MRI of the cervical spine shows a single focus at C2-C3.   She feels gait and strength are doing well.  Her feet get numb a times, right mor ethan left.    She has some intermittent vertigo, often with changes in position.   Occasionally, walking has triggered it.      She has noted more leg pains, worse during her period.  Pain is crampy, helped by Advil.    She gets some HA's and nausea.    Adderall is helping fatigue and attention deficit.   She tolerates it well.      Update 04/30/2017: She is doing well.   She is on Copaxone and tolerates it well.  Her last exacerbation was at diagnosis.   She tolrates the Copaxone injections well.   She gets a red mark x 3- days.     Her gait is normal.   Strength and sensation are generally ok but she gets a bug crawling sensation in the right hip and buttock area.    It is intermittent and aggravating but not painful.    Vision is doing well.    Bladder is fine.     She has physical and cognitive fatigue, helped by Adderall.  Mood has done well on zoloft  Occasionally, she gets a headaches.  Vitamin D has been low and she takes a supplement.   Update 10/30/2016: She appears her MS  is stable and she has not had any more exacerbations. She takes glatiramer (generic Copaxone) and is tolerating it well.      She feels her gait, strength and sensation are unchanged.   She is still getting some tingling in her hands, especially in the mornings and when she has arm raised over her head.    She notes tingling at times in her feet.  Vision is unchanged with occasional left blurring.  Bladder is sometimes a problems with occasioanl stress incontinence with cough/sneeze/laugh.     She has fatigue.   This is both physical and mental.     She sleeps well at night.    She has difficulty with focus and attention.   She also  notes decreased motivation which has worsened this year.    She notes some depression which was worse this summer.  She takes extra vit D.      From 06/19/2016: She was diagnosed with MS in October 2016. In retrospect, about 6 or 7 years ago she had numbness in her hands more than her feet that was moderate for several weeks before it improved. At that time, she was found to have a low vitamin D and that was blamed for the incident.  In late September 2016, she presented with left visual blurring.    She had an MRI of the brain consistent with MS.   She received po steroids and did better    Her headaches associated with the ON also improved.    She was contemplating pregnancy so did not start any DMT.     She does not think she has had any definite exacerbation over the past 1 1/2 years since diagnosis.   However, she had a few episodes of severe exhaustion/faigue lasting 1/2 day.   She had a miscarriage last year  And is still hoping to get pregnant.     Currently, she denies difficulty with her gait or with strength.   She still notes tingling in her hands more than her feet.    She often wakes up with mild numbness in her hands but not pain.   This does not wake her up during the night.   Raising her arm over her hand sometimes causes more numbness.     She notes that she  will gets some stress incontinence if she coughs or sneezes. Otherwise she does not note much difficulty with urinary frequency, urgency or hesitancy.    She does get some urinary tract infections.  She felt vision got back to baseline.   However, she notes that she will get eye strain easily in that eye.   She deneis any change in color vision.     She reports some fatigue, especially in the heat. She usually sleeps well at night. She tries to make sure to get plenty of sleep may have triggered some seizures in the past.  She denies any significant depression or anxiety. She has been moody at times. She denies any significant problems with cognition.  She had 5 seizures over 3-4 years around ages [age] but none the past 14-15 years.   Poor sleep may have triggered some of the seizures.   One occurred after taking a diet energy pill.      Her Vit D was low twice in the past, first teste about 6-7 years ago.   She takes 14-21000 U vit D most days.  I personally reviewed the MRIs of the brain from 10/11/2014, 10/13/2014 and 05/20/2016. The MRIs from October 2016 showed enhancement of the left optic nerve consistent with optic neuritis. Additionally, there were multiple T2/FLAIR hyperintense foci in the periventricular, juxtacortical and deep white matter and also one small one in the right cerebellar hemisphere. These were consistent with a diagnosis of multiple sclerosis. The MRI from 05/20/2016 shows the chronic lesions and additionally shows at least 6 new lesions in the periventricular, deep and juxtacortical white matter with 2 enhancing lesions.    REVIEW OF SYSTEMS: Constitutional: No fevers, chills, sweats, or change in appetite.   Some fatigue Eyes: No visual changes, double vision, eye pain Ear, nose and throat: No hearing loss, ear pain, nasal congestion, sore throat Cardiovascular: No chest pain, palpitations Respiratory: No  shortness of breath at rest or with exertion.   No  wheezes GastrointestinaI: No nausea, vomiting, diarrhea, abdominal pain, fecal incontinence Genitourinary: No dysuria, urinary retention or frequency.  No nocturia. Musculoskeletal: No neck pain, back pain Integumentary: No rash, pruritus, skin lesions Neurological: as above Psychiatric: No depression at this time.  No anxiety Endocrine: No palpitations, diaphoresis, change in appetite, change in weigh or increased thirst Hematologic/Lymphatic: No anemia, purpura, petechiae. Allergic/Immunologic: No itchy/runny eyes, nasal congestion, recent allergic reactions, rashes  ALLERGIES: Allergies  Allergen Reactions   Shellfish Allergy Anaphylaxis    HOME MEDICATIONS:  Current Outpatient Medications:    amphetamine-dextroamphetamine (ADDERALL XR) 15 MG 24 hr capsule, Take 1 capsule by mouth every morning., Disp: 30 capsule, Rfl: 0   Cholecalciferol (VITAMIN D PO), Take 7,000 Units by mouth daily., Disp: , Rfl:    Glatiramer Acetate 40 MG/ML SOSY, INJECT CONTENTS OF ONE SYRINGE (40 MG) INTO THE SKIN 3 (THREE) TIMES A WEEK., Disp: 36 Syringe, Rfl: 3   sertraline (ZOLOFT) 50 MG tablet, TAKE 1 TABLET(50 MG) BY MOUTH DAILY, Disp: 30 tablet, Rfl: 11   SUMAtriptan (IMITREX) 100 MG tablet, Take 1/2 to 1 pill prn migraine.  May repeat in 2 hours if headache persists or recurs., Disp: 10 tablet, Rfl: 5  PAST MEDICAL HISTORY: Past Medical History:  Diagnosis Date   Anxiety    Dysmenorrhea    Multiple sclerosis (HCC)    Seizures (HCC)    Vision abnormalities     PAST SURGICAL HISTORY: Past Surgical History:  Procedure Laterality Date   DILATION AND EVACUATION N/A 12/18/2013   Procedure: DILATATION AND EVACUATION Ultrasound guided;  Surgeon: Dorien Chihuahua. Richardson Dopp, MD;  Location: [facility];  Service: Gynecology;  Laterality: N/A;   TONSILLECTOMY      FAMILY HISTORY: Family History  Problem Relation Age of Onset   Liver cancer Mother    Heart disease Father    AAA (abdominal aortic  aneurysm) Father    Hypertension Father    High Cholesterol Father    Atrial fibrillation Father    Healthy Brother     SOCIAL HISTORY:  Social History   Socioeconomic History   Marital status: Married    Spouse name: Not on file   Number of children: Not on file   Years of education: Not on file   Highest education level: Not on file  Occupational History   Not on file  Social Needs   Financial resource strain: Not on file   Food insecurity:    Worry: Not on file    Inability: Not on file   Transportation needs:    Medical: Not on file    Non-medical: Not on file  Tobacco Use   Smoking status: Current Every Day Smoker   Smokeless tobacco: Never Used  Substance and Sexual Activity   Alcohol use: Yes    Comment: seldom   Drug use: No   Sexual activity: Yes  Lifestyle   Physical activity:    Days per week: Not on file    Minutes per session: Not on file   Stress: Not on file  Relationships   Social connections:    Talks on phone: Not on file    Gets together: Not on file    Attends religious service: Not on file    Active member of club or organization: Not on file    Attends meetings of clubs or organizations: Not on file    Relationship status: Not on file  Intimate partner violence:    Fear of current or ex partner: Not on file    Emotionally abused: Not on file    Physically abused: Not on file    Forced sexual activity: Not on file  Other Topics Concern   Not on file  Social History Narrative   Not on file     PHYSICAL EXAM  There were no vitals filed for this visit.  There is no height or weight on file to calculate BMI.   General: The patient is well-developed and well-nourished and in no acute distress   Neurologic Exam  Mental status: The patient is alert and oriented x 3 at the time of the examination. The patient has apparent normal recent and remote memory, with an apparently normal attention span and  concentration ability.   Speech is normal.  Cranial nerves: Extraocular movements are full.  Facial strength and sensation was normal.  Trapezius strength was normal.    No obvious hearing deficits are noted.  Motor:  Muscle bulk is normal.   Tone is normal. Strength is  5 / 5 in all 4 extremities.   Sensory: She had intact sensation to touch and vibration in the arms and legs.  Coordination: Finger-nose-finger and heel-to-shin is performed well..  Gait and station: Station is normal.  Gait is normal.  Tandem gait is mildly wide..  Romberg is negative.   Reflexes: Deep tendon reflexes are symmetric.  Knee reflexes are increased with spread. Mildly increased ankle reflexes.  There is no ankle clonus.      DIAGNOSTIC DATA (LABS, IMAGING, TESTING) - I reviewed patient records, labs, notes, testing and imaging myself where available.  Lab Results  Component Value Date   WBC 23.8 (H) 12/18/2013   HGB 10.4 (L) 12/18/2013   HCT 30.1 (L) 12/18/2013   MCV 88.0 12/18/2013   PLT 265 12/18/2013      Component Value Date/Time   GLUCOSE 88 06/08/2011 0928    Lab Results  Component Value Date   TSH 2.720 06/19/2016       ASSESSMENT AND PLAN  Multiple sclerosis (HCC)  Optic neuritis, left  Other fatigue  History of seizures  Low vitamin D level  Depression, unspecified depression type  Migraine without aura and without status migrainosus, not intractable   Chelsea Wubben A. Epimenio Foot, MD, PhD, FAAN Certified in Neurology, Clinical Neurophysiology, Sleep Medicine, Pain Medicine and Neuroimaging Director, [facility]  [facility] [address] [place], [place] [address] [phone]

## 2018-06-08 ENCOUNTER — Other Ambulatory Visit: Payer: Self-pay

## 2018-06-08 MED ORDER — AMPHETAMINE-DEXTROAMPHET ER 15 MG PO CP24: 15.0000 mg | ORAL_CAPSULE | ORAL | 0 refills | Status: DC

## 2018-06-08 NOTE — Telephone Encounter (Signed)
Worth Database Verified LR: 05/07/2018 Qty: 30 Pending appointment: No pending appt

## 2018-07-07 ENCOUNTER — Other Ambulatory Visit: Payer: Self-pay | Admitting: Neurology

## 2018-07-07 MED ORDER — AMPHETAMINE-DEXTROAMPHET ER 15 MG PO CP24: 15.0000 mg | ORAL_CAPSULE | ORAL | 0 refills | Status: DC

## 2018-07-07 NOTE — Telephone Encounter (Signed)
Pt is requesting a refill of amphetamine-dextroamphetamine (ADDERALL XR) 15 MG 24 hr capsule , to be sent to [id number] - [place], [place] - [address] AT [place] [address]

## 2018-07-27 ENCOUNTER — Other Ambulatory Visit: Payer: Self-pay | Admitting: *Deleted

## 2018-07-27 DIAGNOSIS — G35 Multiple sclerosis: Secondary | ICD-10-CM

## 2018-07-27 MED ORDER — GLATIRAMER ACETATE 40 MG/ML ~~LOC~~ SOSY: PREFILLED_SYRINGE | SUBCUTANEOUS | 3 refills | Status: DC

## 2018-08-06 ENCOUNTER — Other Ambulatory Visit: Payer: Self-pay

## 2018-08-06 MED ORDER — AMPHETAMINE-DEXTROAMPHET ER 15 MG PO CP24: 15.0000 mg | ORAL_CAPSULE | ORAL | 0 refills | Status: DC

## 2018-09-08 ENCOUNTER — Other Ambulatory Visit: Payer: Self-pay

## 2018-09-08 ENCOUNTER — Other Ambulatory Visit: Payer: Self-pay | Admitting: *Deleted

## 2018-09-08 DIAGNOSIS — G35 Multiple sclerosis: Secondary | ICD-10-CM

## 2018-09-08 MED ORDER — AMPHETAMINE-DEXTROAMPHET ER 15 MG PO CP24: 15.0000 mg | ORAL_CAPSULE | ORAL | 0 refills | Status: DC

## 2018-09-08 MED ORDER — GLATIRAMER ACETATE 40 MG/ML ~~LOC~~ SOSY: PREFILLED_SYRINGE | SUBCUTANEOUS | 3 refills | Status: DC

## 2018-10-13 ENCOUNTER — Other Ambulatory Visit: Payer: Self-pay

## 2018-10-13 MED ORDER — AMPHETAMINE-DEXTROAMPHET ER 15 MG PO CP24: 15.0000 mg | ORAL_CAPSULE | ORAL | 0 refills | Status: DC

## 2018-10-13 NOTE — Telephone Encounter (Signed)
Checked drug registry. She last refilled rx 09/08/2018 #30. Last seen 05/11/18 and next f/u 11/11/18.

## 2018-11-11 ENCOUNTER — Ambulatory Visit: Payer: 59 | Admitting: Neurology

## 2018-11-11 ENCOUNTER — Encounter: Payer: Self-pay | Admitting: Neurology

## 2018-11-11 ENCOUNTER — Other Ambulatory Visit: Payer: Self-pay

## 2018-11-11 VITALS — BP 146/95 | HR 95 | Temp 97.4°F | Ht 62.0 in | Wt 163.0 lb

## 2018-11-11 DIAGNOSIS — F988 Other specified behavioral and emotional disorders with onset usually occurring in childhood and adolescence: Secondary | ICD-10-CM | POA: Insufficient documentation

## 2018-11-11 DIAGNOSIS — R5383 Other fatigue: Secondary | ICD-10-CM

## 2018-11-11 DIAGNOSIS — Z87898 Personal history of other specified conditions: Secondary | ICD-10-CM | POA: Diagnosis not present

## 2018-11-11 DIAGNOSIS — G35 Multiple sclerosis: Secondary | ICD-10-CM

## 2018-11-11 MED ORDER — LISDEXAMFETAMINE DIMESYLATE 30 MG PO CAPS: 30.0000 mg | ORAL_CAPSULE | Freq: Every day | ORAL | 0 refills | Status: DC

## 2018-11-11 NOTE — Progress Notes (Signed)
[facility]  PATIENT: Chelsea Gentry DOB: Nov 17, 1980  REFERRING DOCTOR OR PCP:  Referred by Dr. Sabra Heck ([facility]);   Earnstine Regal, PA-C ([facility]) SOURCE: patient, notes from Dr. Sabra Heck, labs and imaging reports since 2016,   _________________________________   HISTORICAL  CHIEF COMPLAINT:  Chief Complaint  Patient presents with  . Follow-up    RM 13, alone. Last seen 05/11/18. Tolerating copaxone well, no new sx. I made her aware mychart message sent to her today about pharmacy trying to reach her for a refill but she states she does not need one yet.     HISTORY OF PRESENT ILLNESS:   Chelsea Gentry is a 38 y.o. woman with multiple sclerosis diagnosed in 2016 and history of seizures as an adolescent  Update 11/11/2018: She feels her MS stable.   Gait and balance are good.   She denies weakness in the arms or legs.   She has some numbness and tingling from carpal tunnel syndrome.   Sometimes has more numbness if more active.   She denies any problems with her bladder.   Vision is doing well.   She snores a little and has been noted by her husband to have pauses and some gasping at night.   She is not sleepy during the day.     No seizures x 18-20 years.     She is on Adderall XR 15 for attention deficit and MS fatigue.  Her husband  MRI 08/24/2017 showed a focus at [place] (right) and possibly a second one at T1.   Brain showed multiple classic MS lesions mostly in periventricular white matter with a couple not present in 2016.   Of note she chose not to go on medication in 2016 as she was trying to get pregnant at the time.      Update 05/11/2018 (virtual) She feels her MS is stable.  She denies any exacerbations or new symptoms.  She has been on Copaxone since 2018.  She was diagnosed with MS October 2016 but due to a desire for a pregnancy she did not start a disease modifying therapy.  In 2018 MRI showed 6 enhancing lesions and she did start Copaxone.  She  tolerates it well with just very minimal skin injection site reactions that are not troublesome.  No systemic side effects.  Her last MRI performed 08/22/2017 did not show any new lesions.  She also has one focus in the cervical spine to the right at C2-C3.  Gait is doing well.  Strength and sensation are fine.  Balance is fine.  She does not have any vertigo.  Bladder function is fine.Her vision returned to baseline.   Attention deficit is helped by Adderall.  Fatigue is also better later in the day with the Adderall.    She has had a little bit of irritability at times and thinks the Adderall may make it worse.  She is on Zoloft for depression and feels her mood has done better.  Vertigo is better.   She notes some migraine headaches, especially around her menstrual cycle for a week each month.  She gets nausea, photophobia and phonophobia.  Moving worsens the headache.  Ibuprofen and Excedrin help a little bit.     Update 11/10/2017: Her MS is doing well and she has not had any exacerbations on Copaxone since dx in 2016 after right ON.    She is doing well with the Copaxone shots.  Skin reactions have all been  mild.  I took another look at the MRI of the brain from 08/22/2017.  There are no new lesions compared to the May 2018 MRI though there are a couple foci that were not present on the 2016 MRI.  MRI of the cervical spine shows a single focus at C2-C3.   She feels gait and strength are doing well.  Her feet get numb a times, right mor ethan left.    She has some intermittent vertigo, often with changes in position.   Occasionally, walking has triggered it.      She has noted more leg pains, worse during her period.  Pain is crampy, helped by Advil.    She gets some HA's and nausea.    Adderall is helping fatigue and attention deficit.   She tolerates it well.     Update 04/30/2017: She is doing well.   She is on Copaxone and tolerates it well.  Her last exacerbation was at diagnosis.   She  tolrates the Copaxone injections well.   She gets a red mark x 3- days.     Her gait is normal.   Strength and sensation are generally ok but she gets a bug crawling sensation in the right hip and buttock area.    It is intermittent and aggravating but not painful.    Vision is doing well.    Bladder is fine.     She has physical and cognitive fatigue, helped by Adderall.  Mood has done well on zoloft  Occasionally, she gets a headaches.  Vitamin D has been low and she takes a supplement.   Update 10/30/2016: She appears her MS is stable and she has not had any more exacerbations. She takes glatiramer (generic Copaxone) and is tolerating it well.      She feels her gait, strength and sensation are unchanged.   She is still getting some tingling in her hands, especially in the mornings and when she has arm raised over her head.    She notes tingling at times in her feet.  Vision is unchanged with occasional left blurring.  Bladder is sometimes a problems with occasioanl stress incontinence with cough/sneeze/laugh.     She has fatigue.   This is both physical and mental.     She sleeps well at night.    She has difficulty with focus and attention.   She also notes decreased motivation which has worsened this year.    She notes some depression which was worse this summer.  She takes extra vit D.      From 06/19/2016: She was diagnosed with MS in October 2016. In retrospect, about 6 or 7 years ago she had numbness in her hands more than her feet that was moderate for several weeks before it improved. At that time, she was found to have a low vitamin D and that was blamed for the incident.  In late September 2016, she presented with left visual blurring.    She had an MRI of the brain consistent with MS.   She received po steroids and did better    Her headaches associated with the ON also improved.    She was contemplating pregnancy so did not start any DMT.     She does not think she has had any definite  exacerbation over the past 1 1/2 years since diagnosis.   However, she had a few episodes of severe exhaustion/faigue lasting 1/2 day.   She had a miscarriage last  year  And is still hoping to get pregnant.     Currently, she denies difficulty with her gait or with strength.   She still notes tingling in her hands more than her feet.    She often wakes up with mild numbness in her hands but not pain.   This does not wake her up during the night.   Raising her arm over her hand sometimes causes more numbness.     She notes that she will gets some stress incontinence if she coughs or sneezes. Otherwise she does not note much difficulty with urinary frequency, urgency or hesitancy.    She does get some urinary tract infections.  She felt vision got back to baseline.   However, she notes that she will get eye strain easily in that eye.   She deneis any change in color vision.     She reports some fatigue, especially in the heat. She usually sleeps well at night. She tries to make sure to get plenty of sleep may have triggered some seizures in the past.  She denies any significant depression or anxiety. She has been moody at times. She denies any significant problems with cognition.  She had 5 seizures over 3-4 years around ages [age] but none the past 14-15 years.   Poor sleep may have triggered some of the seizures.   One occurred after taking a diet energy pill.      Her Vit D was low twice in the past, first teste about 6-7 years ago.   She takes 14-21000 U vit D most days.  I personally reviewed the MRIs of the brain from 10/11/2014, 10/13/2014 and 05/20/2016. The MRIs from October 2016 showed enhancement of the left optic nerve consistent with optic neuritis. Additionally, there were multiple T2/FLAIR hyperintense foci in the periventricular, juxtacortical and deep white matter and also one small one in the right cerebellar hemisphere. These were consistent with a diagnosis of multiple sclerosis.  The MRI from 05/20/2016 shows the chronic lesions and additionally shows at least 6 new lesions in the periventricular, deep and juxtacortical white matter with 2 enhancing lesions.    REVIEW OF SYSTEMS: Constitutional: No fevers, chills, sweats, or change in appetite.   Some fatigue Eyes: No visual changes, double vision, eye pain Ear, nose and throat: No hearing loss, ear pain, nasal congestion, sore throat Cardiovascular: No chest pain, palpitations Respiratory: No shortness of breath at rest or with exertion.   No wheezes GastrointestinaI: No nausea, vomiting, diarrhea, abdominal pain, fecal incontinence Genitourinary: No dysuria, urinary retention or frequency.  No nocturia. Musculoskeletal: No neck pain, back pain Integumentary: No rash, pruritus, skin lesions Neurological: as above Psychiatric: No depression at this time.  No anxiety Endocrine: No palpitations, diaphoresis, change in appetite, change in weigh or increased thirst Hematologic/Lymphatic: No anemia, purpura, petechiae. Allergic/Immunologic: No itchy/runny eyes, nasal congestion, recent allergic reactions, rashes  ALLERGIES: Allergies  Allergen Reactions  . Shellfish Allergy Anaphylaxis    HOME MEDICATIONS:  Current Outpatient Medications:  .  amphetamine-dextroamphetamine (ADDERALL XR) 15 MG 24 hr capsule, Take 1 capsule by mouth every morning., Disp: 30 capsule, Rfl: 0 .  Cholecalciferol (VITAMIN D PO), Take 7,000 Units by mouth daily., Disp: , Rfl:  .  Glatiramer Acetate 40 MG/ML SOSY, INJECT CONTENTS OF ONE SYRINGE (40 MG) INTO THE SKIN 3 (THREE) TIMES A WEEK., Disp: 36 mL, Rfl: 3 .  sertraline (ZOLOFT) 50 MG tablet, TAKE 1 TABLET(50 MG) BY MOUTH DAILY, Disp: 30 tablet, Rfl:  11 .  SUMAtriptan (IMITREX) 100 MG tablet, Take 1/2 to 1 pill prn migraine.  May repeat in 2 hours if headache persists or recurs., Disp: 10 tablet, Rfl: 5 .  lisdexamfetamine (VYVANSE) 30 MG capsule, Take 1 capsule (30 mg total) by  mouth daily., Disp: 30 capsule, Rfl: 0  PAST MEDICAL HISTORY: Past Medical History:  Diagnosis Date  . Anxiety   . Dysmenorrhea   . Multiple sclerosis (HCC)   . Seizures (HCC)   . Vision abnormalities     PAST SURGICAL HISTORY: Past Surgical History:  Procedure Laterality Date  . DILATION AND EVACUATION N/A 12/18/2013   Procedure: DILATATION AND EVACUATION Ultrasound guided;  Surgeon: Dorien Chihuahua. Richardson Dopp, MD;  Location: [facility];  Service: Gynecology;  Laterality: N/A;  . TONSILLECTOMY      FAMILY HISTORY: Family History  Problem Relation Age of Onset  . Liver cancer Mother   . Heart disease Father   . AAA (abdominal aortic aneurysm) Father   . Hypertension Father   . High Cholesterol Father   . Atrial fibrillation Father   . Healthy Brother     SOCIAL HISTORY:  Social History   Socioeconomic History  . Marital status: Married    Spouse name: Not on file  . Number of children: Not on file  . Years of education: Not on file  . Highest education level: Not on file  Occupational History  . Not on file  Social Needs  . Financial resource strain: Not on file  . Food insecurity    Worry: Not on file    Inability: Not on file  . Transportation needs    Medical: Not on file    Non-medical: Not on file  Tobacco Use  . Smoking status: Current Every Day Smoker  . Smokeless tobacco: Never Used  Substance and Sexual Activity  . Alcohol use: Yes    Comment: seldom  . Drug use: No  . Sexual activity: Yes  Lifestyle  . Physical activity    Days per week: Not on file    Minutes per session: Not on file  . Stress: Not on file  Relationships  . Social Musician on phone: Not on file    Gets together: Not on file    Attends religious service: Not on file    Active member of club or organization: Not on file    Attends meetings of clubs or organizations: Not on file    Relationship status: Not on file  . Intimate partner violence    Fear of current or ex  partner: Not on file    Emotionally abused: Not on file    Physically abused: Not on file    Forced sexual activity: Not on file  Other Topics Concern  . Not on file  Social History Narrative  . Not on file     PHYSICAL EXAM  Vitals:   11/11/18 1622  BP: (!) 146/95  Pulse: 95  Temp: (!) 97.4 F (36.3 C)  Weight: 163 lb (73.9 kg)  Height: 5\' 2"  (1.575 m)    Body mass index is 29.81 kg/m.   General: The patient is well-developed and well-nourished and in no acute distress   Neurologic Exam  Mental status: The patient is alert and oriented x 3 at the time of the examination. The patient has apparent normal recent and remote memory, with an apparently normal attention span and concentration ability.   Speech is normal.  Cranial nerves:  Extraocular movements are full.  Facial strength and sensation was normal.  Trapezius strength was normal.    No obvious hearing deficits are noted.  Motor:  Muscle bulk is normal.   Tone is normal. Strength is  5 / 5 in all 4 extremities.   Sensory: She had intact sensation to touch and vibration in the arms and legs.  Coordination: Finger-nose-finger and heel-to-shin is performed well..  Gait and station: Station is normal.  The gait is normal.  Tandem gait is mildly wide.  Romberg is negative.   Reflexes: Deep tendon reflexes are symmetric.  Knee reflexes are increased with spread. Mildly increased ankle reflexes.  There is no ankle clonus.      ASSESSMENT AND PLAN  Multiple sclerosis (HCC) - Plan: CBC with Differential/Platelets, Hepatic Function Panel, MR BRAIN WO CONTRAST  Other fatigue  History of seizures  Attention deficit disorder (ADD) without hyperactivity   1.   She has difficulty tolerating Copaxone/glatiramer.  We discussed some other options.  Due to skin reactions and needle fatigue, she would prefer an oral agent.  We will check an MRI of the brain to determine if there has been any breakthrough activity. 2.    Begin Vumerity.  She is thinking about pregnancy and this will allow her to stop the medication and get it quickly out of her body.  Because she has had some stomach issues (GERD) Vumerity would be a better choice than Tecfidera. 3.   Stay active and exercise as tolerated. 4.    Adderall XR has been associated with some mild cognitive issues though it has helped her attention deficit.  I will place her on Vyvanse as it may be better tolerated for her and give her benefit throughout the day. 5.   Return in 6 months or sooner if there are new or worsening neurologic symptoms.   Aqsa Sensabaugh A. Epimenio FootSater, MD, PhD, FAAN Certified in Neurology, Clinical Neurophysiology, Sleep Medicine, Pain Medicine and Neuroimaging Director, [facility]  [facility] [address], [address] [place], [place] [address] [phone]

## 2018-11-12 ENCOUNTER — Telehealth: Payer: Self-pay | Admitting: Neurology

## 2018-11-12 ENCOUNTER — Telehealth: Payer: Self-pay | Admitting: *Deleted

## 2018-11-12 LAB — HEPATIC FUNCTION PANEL
ALT: 9 IU/L (ref 0–32)
AST: 13 IU/L (ref 0–40)
Albumin: 4.5 g/dL (ref 3.8–4.8)
Alkaline Phosphatase: 63 IU/L (ref 39–117)
Bilirubin Total: <0.2 mg/dL (ref 0.0–1.2)
Bilirubin, Direct: 0.07 mg/dL (ref 0.00–0.40)
Total Protein: 6.9 g/dL (ref 6.0–8.5)

## 2018-11-12 LAB — CBC WITH DIFFERENTIAL/PLATELET
Basophils Absolute: 0.1 10*3/uL (ref 0.0–0.2)
Basos: 1 %
EOS (ABSOLUTE): 0.1 10*3/uL (ref 0.0–0.4)
Eos: 1 %
Hematocrit: 44.3 % (ref 34.0–46.6)
Hemoglobin: 14.8 g/dL (ref 11.1–15.9)
Immature Grans (Abs): 0 10*3/uL (ref 0.0–0.1)
Immature Granulocytes: 0 %
Lymphocytes Absolute: 3.8 10*3/uL — ABNORMAL HIGH (ref 0.7–3.1)
Lymphs: 40 %
MCH: 29.7 pg (ref 26.6–33.0)
MCHC: 33.4 g/dL (ref 31.5–35.7)
MCV: 89 fL (ref 79–97)
Monocytes Absolute: 0.7 10*3/uL (ref 0.1–0.9)
Monocytes: 8 %
Neutrophils Absolute: 4.7 10*3/uL (ref 1.4–7.0)
Neutrophils: 50 %
Platelets: 294 10*3/uL (ref 150–450)
RBC: 4.99 x10E6/uL (ref 3.77–5.28)
RDW: 11.9 % (ref 11.7–15.4)
WBC: 9.4 10*3/uL (ref 3.4–10.8)

## 2018-11-12 NOTE — Telephone Encounter (Signed)
Received fax notification from [employer] that they received Vumerity start form and processing for pt. Questions: [phone].

## 2018-11-12 NOTE — Telephone Encounter (Signed)
[employer] Auth: [id number] (exp. 11/12/18 to 12/27/18) order sent to [facility]. They will reach out to the patient to schedule.

## 2018-11-12 NOTE — Telephone Encounter (Signed)
-----   Message from Britt Bottom, MD sent at 11/12/2018 11:03 AM EST ----- Labs are fine.   We can send in vumierity

## 2018-11-13 ENCOUNTER — Other Ambulatory Visit: Payer: Self-pay | Admitting: Neurology

## 2018-11-18 ENCOUNTER — Telehealth: Payer: Self-pay | Admitting: *Deleted

## 2018-11-18 ENCOUNTER — Other Ambulatory Visit: Payer: Self-pay | Admitting: *Deleted

## 2018-11-18 MED ORDER — AMPHETAMINE-DEXTROAMPHET ER 15 MG PO CP24: 15.0000 mg | ORAL_CAPSULE | ORAL | 0 refills | Status: DC

## 2018-11-18 MED ORDER — ALPRAZOLAM 0.5 MG PO TABS: ORAL_TABLET | ORAL | 0 refills | Status: DC

## 2018-11-18 NOTE — Telephone Encounter (Signed)
Submitted PA Vumerity on CMM. Key: [id number]. Waiting on determination from [facility].

## 2018-11-18 NOTE — Telephone Encounter (Signed)
Submitted PA Vyvanse on CMM. Key: [id number]. Waiting on determination from [facility].

## 2018-11-18 NOTE — Telephone Encounter (Signed)
Request Reference Number: [id number]. VYVANSE CAP 30MG  is approved through 11/18/2019. For further questions, call [phone].

## 2018-11-19 ENCOUNTER — Encounter: Payer: Self-pay | Admitting: *Deleted

## 2018-11-25 NOTE — Telephone Encounter (Signed)
PA for Vumerity has been denied.   Per verbal from Dr. Felecia Shelling, ok to attempt appeal letter.   I have formualated the appeal letter and is a waiting MD signature. Once signed I will fax to the appeals department with [facility] Fax # is [phone]

## 2018-11-27 NOTE — Telephone Encounter (Signed)
Stacy from [facility] called wanting to inform us that the denial has been upheld and that they will be mailing out letters with the information.

## 2018-11-30 NOTE — Telephone Encounter (Signed)
PA/ Appeal letter for Vumerity has been denied. Ollen Barges fwd to provider for next steps.

## 2018-11-30 NOTE — Telephone Encounter (Signed)
We will need to place her on Tecfidera then.  Can this be sent in or will she need a service request  If she feels Tecfidera due to GI symptoms, we will see we can get Vum form?  [place] approved.

## 2018-12-01 ENCOUNTER — Telehealth: Payer: Self-pay

## 2018-12-01 NOTE — Telephone Encounter (Signed)
Start form for Tecfidera has been sent to the pt via mail. Pt is going to complete and either send back via mail or my chart.

## 2018-12-01 NOTE — Telephone Encounter (Signed)
Additionally, the original Vumerity start form will be kept (in Dr. Garth Bigness patient paperwork cabinet) just in case she is not able to tolerate Tecfidera.

## 2018-12-01 NOTE — Telephone Encounter (Signed)
I have sent a my chart message to the pt about the required start form for the tecfidera. Waiting on the pt to respond.

## 2018-12-14 ENCOUNTER — Other Ambulatory Visit: Payer: Self-pay

## 2018-12-14 MED ORDER — LISDEXAMFETAMINE DIMESYLATE 30 MG PO CAPS: 30.0000 mg | ORAL_CAPSULE | Freq: Every day | ORAL | 0 refills | Status: DC

## 2018-12-17 ENCOUNTER — Telehealth: Payer: Self-pay | Admitting: Neurology

## 2018-12-17 DIAGNOSIS — G35 Multiple sclerosis: Secondary | ICD-10-CM

## 2018-12-17 MED ORDER — DIMETHYL FUMARATE 120 & 240 MG PO MISC: ORAL | 0 refills | Status: DC

## 2018-12-17 MED ORDER — ALPRAZOLAM 0.5 MG PO TABS: ORAL_TABLET | ORAL | 0 refills | Status: DC

## 2018-12-17 MED ORDER — DIMETHYL FUMARATE 240 MG PO CPDR: DELAYED_RELEASE_CAPSULE | ORAL | 11 refills | Status: DC

## 2018-12-17 NOTE — Telephone Encounter (Signed)
Pt called stating that she is needing a Xanax for her MRI that is scheduled tomorrow and she is also wanting to know the update on the medication that she was to be given in the place of Vumerity. Please advise.

## 2018-12-17 NOTE — Telephone Encounter (Signed)
Called pt back. Advised I will send request for xanax rx to be escribed by MD for MRI tomorrow. She verbalized understanding.   She never received Tecfidera start form in the mail. She would like it faxed. I recommended I speak with MD first because we have found insurance no longer covering name brand since there is a generic available. We would not need start form for generic. Advised I will call back about this after speaking with MD.  I checked drug registry. I do not see where she has filled rx xanax sent 11/18/18 to [facility]/[address] Called pharmacy [phone]. Spoke with Mendel Ryder. Rx was closed out, new rx needs to be called in.   I spoke with Dr. Felecia Shelling who was ok to send in generic dimethyl fumarate (generic tecfidera) instead to specialty pharmacy. I called pt back and she is okay to try generic. Explained she will need to do titration pack first prior to working up to maintenance dose. We will send two prescriptions to [facility]. She needs to take titration pack first. Insurance may require PA which we will complete. She verbalized understanding.

## 2018-12-18 ENCOUNTER — Other Ambulatory Visit: Payer: Self-pay

## 2018-12-18 ENCOUNTER — Ambulatory Visit
Admission: RE | Admit: 2018-12-18 | Discharge: 2018-12-18 | Disposition: A | Discharge status: Home / Self Care | Payer: 59 | Source: Ambulatory Visit | Attending: Neurology | Admitting: Neurology

## 2018-12-18 DIAGNOSIS — G35 Multiple sclerosis: Secondary | ICD-10-CM | POA: Diagnosis not present

## 2018-12-21 ENCOUNTER — Telehealth: Payer: Self-pay | Admitting: *Deleted

## 2018-12-21 NOTE — Telephone Encounter (Signed)
-----   Message from Britt Bottom, MD sent at 12/18/2018  4:00 PM EST ----- Please let her know that the MRI of the brain looked good.  There were no new lesions.

## 2019-01-15 ENCOUNTER — Other Ambulatory Visit: Payer: Self-pay

## 2019-01-18 MED ORDER — LISDEXAMFETAMINE DIMESYLATE 30 MG PO CAPS: 30.0000 mg | ORAL_CAPSULE | Freq: Every day | ORAL | 0 refills | Status: DC

## 2019-02-01 ENCOUNTER — Telehealth: Payer: Self-pay | Admitting: *Deleted

## 2019-02-01 NOTE — Telephone Encounter (Signed)
Received fax notification from [facility] that dimethyl fumarate approved through 01/20/20. Ref # [id number].

## 2019-02-17 ENCOUNTER — Other Ambulatory Visit: Payer: Self-pay | Admitting: *Deleted

## 2019-02-17 ENCOUNTER — Other Ambulatory Visit: Payer: Self-pay

## 2019-02-17 MED ORDER — LISDEXAMFETAMINE DIMESYLATE 30 MG PO CAPS: 30.0000 mg | ORAL_CAPSULE | Freq: Every day | ORAL | 0 refills | Status: DC

## 2019-02-17 MED ORDER — RIZATRIPTAN BENZOATE 10 MG PO TBDP: 10.0000 mg | ORAL_TABLET | ORAL | 11 refills | Status: DC | PRN

## 2019-02-17 MED ORDER — ONDANSETRON 4 MG PO TBDP: ORAL_TABLET | ORAL | 2 refills | Status: DC

## 2019-03-19 ENCOUNTER — Other Ambulatory Visit: Payer: Self-pay

## 2019-03-22 ENCOUNTER — Other Ambulatory Visit: Payer: Self-pay

## 2019-03-23 ENCOUNTER — Telehealth: Payer: Self-pay | Admitting: Neurology

## 2019-03-23 MED ORDER — LISDEXAMFETAMINE DIMESYLATE 30 MG PO CAPS: 30.0000 mg | ORAL_CAPSULE | Freq: Every day | ORAL | 0 refills | Status: DC

## 2019-03-23 NOTE — Telephone Encounter (Signed)
Already sent request to MD. Pending approval.

## 2019-03-23 NOTE — Telephone Encounter (Signed)
1) Medication(s) Requested (by name): lisdexamfetamine (VYVANSE) 30 MG capsule   2) Pharmacy of Choice: [id number] - [place], [place] - [address]  [address], [place]  [phone]

## 2019-04-19 ENCOUNTER — Other Ambulatory Visit: Payer: Self-pay

## 2019-04-19 MED ORDER — LISDEXAMFETAMINE DIMESYLATE 30 MG PO CAPS: 30.0000 mg | ORAL_CAPSULE | Freq: Every day | ORAL | 0 refills | Status: DC

## 2019-04-26 ENCOUNTER — Other Ambulatory Visit: Payer: Self-pay | Admitting: *Deleted

## 2019-04-26 DIAGNOSIS — G35 Multiple sclerosis: Secondary | ICD-10-CM

## 2019-04-26 MED ORDER — DIMETHYL FUMARATE 240 MG PO CPDR: DELAYED_RELEASE_CAPSULE | ORAL | 11 refills | Status: DC

## 2019-05-11 NOTE — Telephone Encounter (Signed)
Called PA department at [phone] and spoke with rep, [place]. He reviewed pt insurance plan/PA and states approval is on file, medication is already at tier 2. She is not eligible to do a tier exception d/t her having commercial plan. Pt must contact member services for further assistance on this. I asked he transfer me to pharmacy to see what copay is for the patient. He placed me on hold. Spoke with Benedetto Goad at the pharmacy. States copay for dimethyl fumarate is 250.00 per 30 days. No type of financial assistance available.   Pt ID: [id number]

## 2019-05-20 ENCOUNTER — Ambulatory Visit: Payer: 59 | Admitting: Neurology

## 2019-05-20 ENCOUNTER — Other Ambulatory Visit: Payer: Self-pay

## 2019-05-20 ENCOUNTER — Encounter: Payer: Self-pay | Admitting: Neurology

## 2019-05-20 ENCOUNTER — Telehealth: Payer: Self-pay | Admitting: *Deleted

## 2019-05-20 VITALS — BP 110/78 | HR 81 | Temp 97.2°F | Ht 62.0 in | Wt 172.6 lb

## 2019-05-20 DIAGNOSIS — F32A Depression, unspecified: Secondary | ICD-10-CM

## 2019-05-20 DIAGNOSIS — Z79899 Other long term (current) drug therapy: Secondary | ICD-10-CM | POA: Insufficient documentation

## 2019-05-20 DIAGNOSIS — F329 Major depressive disorder, single episode, unspecified: Secondary | ICD-10-CM

## 2019-05-20 DIAGNOSIS — Z87898 Personal history of other specified conditions: Secondary | ICD-10-CM | POA: Diagnosis not present

## 2019-05-20 DIAGNOSIS — G35 Multiple sclerosis: Secondary | ICD-10-CM

## 2019-05-20 DIAGNOSIS — R5383 Other fatigue: Secondary | ICD-10-CM

## 2019-05-20 DIAGNOSIS — G43009 Migraine without aura, not intractable, without status migrainosus: Secondary | ICD-10-CM

## 2019-05-20 DIAGNOSIS — F988 Other specified behavioral and emotional disorders with onset usually occurring in childhood and adolescence: Secondary | ICD-10-CM

## 2019-05-20 MED ORDER — LISDEXAMFETAMINE DIMESYLATE 40 MG PO CAPS: 40.0000 mg | ORAL_CAPSULE | Freq: Every day | ORAL | 0 refills | Status: DC

## 2019-05-20 MED ORDER — RIZATRIPTAN BENZOATE 10 MG PO TABS: 10.0000 mg | ORAL_TABLET | ORAL | 11 refills | Status: DC | PRN

## 2019-05-20 NOTE — Telephone Encounter (Signed)
Faxed completed/signed Vumerity start form to [employer] at [phone]. Received fax confirmation.  

## 2019-05-20 NOTE — Progress Notes (Signed)
[facility]  PATIENT: Chelsea Gentry DOB: 07-Jun-1980  REFERRING DOCTOR OR PCP:  Referred by Dr. Hyacinth Meeker ([facility]);   Henreitta Leber, PA-C ([facility]) SOURCE: patient, notes from Dr. Hyacinth Meeker, labs and imaging reports since 2016,   _________________________________   HISTORICAL  CHIEF COMPLAINT:  Chief Complaint  Patient presents with  . Follow-up    RM 13, alone. Last seen 11/11/2018  . Multiple Sclerosis    on dimethyl fumarate but copay over 200.00. Unable to find financial assistance. Would like to discuss other options.     HISTORY OF PRESENT ILLNESS:   Chelsea Gentry is a 39 y.o. woman with relapsing remitting multiple sclerosis diagnosed in 2016   Update 05/20/2019: She feels that she has been stable over the past year. She was switched from Tecfidera to generic DMF.   Her copay is very high and we discussed switching to Vumerity.     Gait and balance are doing well.  She has no trouble with stairs.   She denies weakness but gets sone numbness at times in her hands.   Bladder function is the same - she has stress incontinence.   She has never taken anything for it and would prefer to hold off for now.     She notes apathy more than depressive mood,   She has some fatigue.    She is not sure if Zoloft helps.   She has mild anxiety.   Vyvanse does not seem to help as much as Adderall but she is 'less mean'.    She has migraine headaches.   Imitrex helps but she feels strange on the generic.   Maxalt -MLT worked but she did not like the MLT.   She denies any seizures.    Update 11/11/2018: She feels her MS stable.   Gait and balance are good.   She denies weakness in the arms or legs.   She has some numbness and tingling from carpal tunnel syndrome.   Sometimes has more numbness if more active.   She denies any problems with her bladder.   Vision is doing well.   She snores a little and has been noted by her husband to have pauses and some gasping at  night.   She is not sleepy during the day.     No seizures x 18-20 years.     She is on Adderall XR 15 for attention deficit and MS fatigue.  Her husband  MRI 08/24/2017 showed a focus at C2C3 (right) and possibly a second one at T1.   Brain showed multiple classic MS lesions mostly in periventricular white matter with a couple not present in 2016.   Of note she chose not to go on medication in 2016 as she was trying to get pregnant at the time.      Update 05/11/2018 (virtual) She feels her MS is stable.  She denies any exacerbations or new symptoms.  She has been on Copaxone since 2018.  She was diagnosed with MS October 2016 but due to a desire for a pregnancy she did not start a disease modifying therapy.  In 2018 MRI showed 6 enhancing lesions and she did start Copaxone.  She tolerates it well with just very minimal skin injection site reactions that are not troublesome.  No systemic side effects.  Her last MRI performed 08/22/2017 did not show any new lesions.  She also has one focus in the cervical spine to the right at C2-C3.  Gait is doing well.  Strength and sensation are fine.  Balance is fine.  She does not have any vertigo.  Bladder function is fine.Her vision returned to baseline.   Attention deficit is helped by Adderall.  Fatigue is also better later in the day with the Adderall.    She has had a little bit of irritability at times and thinks the Adderall may make it worse.  She is on Zoloft for depression and feels her mood has done better.  Vertigo is better.   She notes some migraine headaches, especially around her menstrual cycle for a week each month.  She gets nausea, photophobia and phonophobia.  Moving worsens the headache.  Ibuprofen and Excedrin help a little bit.     Update 11/10/2017: Her MS is doing well and she has not had any exacerbations on Copaxone since dx in 2016 after right ON.    She is doing well with the Copaxone shots.  Skin reactions have all been mild.  I took  another look at the MRI of the brain from 08/22/2017.  There are no new lesions compared to the May 2018 MRI though there are a couple foci that were not present on the 2016 MRI.  MRI of the cervical spine shows a single focus at C2-C3.   She feels gait and strength are doing well.  Her feet get numb a times, right mor ethan left.    She has some intermittent vertigo, often with changes in position.   Occasionally, walking has triggered it.      She has noted more leg pains, worse during her period.  Pain is crampy, helped by Advil.    She gets some HA's and nausea.    Adderall is helping fatigue and attention deficit.   She tolerates it well.     Update 04/30/2017: She is doing well.   She is on Copaxone and tolerates it well.  Her last exacerbation was at diagnosis.   She tolrates the Copaxone injections well.   She gets a red mark x 3- days.     Her gait is normal.   Strength and sensation are generally ok but she gets a bug crawling sensation in the right hip and buttock area.    It is intermittent and aggravating but not painful.    Vision is doing well.    Bladder is fine.     She has physical and cognitive fatigue, helped by Adderall.  Mood has done well on zoloft  Occasionally, she gets a headaches.  Vitamin D has been low and she takes a supplement.   Update 10/30/2016: She appears her MS is stable and she has not had any more exacerbations. She takes glatiramer (generic Copaxone) and is tolerating it well.      She feels her gait, strength and sensation are unchanged.   She is still getting some tingling in her hands, especially in the mornings and when she has arm raised over her head.    She notes tingling at times in her feet.  Vision is unchanged with occasional left blurring.  Bladder is sometimes a problems with occasioanl stress incontinence with cough/sneeze/laugh.     She has fatigue.   This is both physical and mental.     She sleeps well at night.    She has difficulty with focus  and attention.   She also notes decreased motivation which has worsened this year.    She notes some depression which was  worse this summer.  She takes extra vit D.      From 06/19/2016: She was diagnosed with MS in October 2016. In retrospect, about 6 or 7 years ago she had numbness in her hands more than her feet that was moderate for several weeks before it improved. At that time, she was found to have a low vitamin D and that was blamed for the incident.  In late September 2016, she presented with left visual blurring.    She had an MRI of the brain consistent with MS.   She received po steroids and did better    Her headaches associated with the ON also improved.    She was contemplating pregnancy so did not start any DMT.     She does not think she has had any definite exacerbation over the past 1 1/2 years since diagnosis.   However, she had a few episodes of severe exhaustion/faigue lasting 1/2 day.   She had a miscarriage last year  And is still hoping to get pregnant.     Currently, she denies difficulty with her gait or with strength.   She still notes tingling in her hands more than her feet.    She often wakes up with mild numbness in her hands but not pain.   This does not wake her up during the night.   Raising her arm over her hand sometimes causes more numbness.     She notes that she will gets some stress incontinence if she coughs or sneezes. Otherwise she does not note much difficulty with urinary frequency, urgency or hesitancy.    She does get some urinary tract infections.  She felt vision got back to baseline.   However, she notes that she will get eye strain easily in that eye.   She deneis any change in color vision.     She reports some fatigue, especially in the heat. She usually sleeps well at night. She tries to make sure to get plenty of sleep may have triggered some seizures in the past.  She denies any significant depression or anxiety. She has been moody at times. She  denies any significant problems with cognition.  She had 5 seizures over 3-4 years around ages [age] but none the past 14-15 years.   Poor sleep may have triggered some of the seizures.   One occurred after taking a diet energy pill.      Her Vit D was low twice in the past, first teste about 6-7 years ago.   She takes 14-21000 U vit D most days.  I personally reviewed the MRIs of the brain from 10/11/2014, 10/13/2014 and 05/20/2016. The MRIs from October 2016 showed enhancement of the left optic nerve consistent with optic neuritis. Additionally, there were multiple T2/FLAIR hyperintense foci in the periventricular, juxtacortical and deep white matter and also one small one in the right cerebellar hemisphere. These were consistent with a diagnosis of multiple sclerosis. The MRI from 05/20/2016 shows the chronic lesions and additionally shows at least 6 new lesions in the periventricular, deep and juxtacortical white matter with 2 enhancing lesions.    REVIEW OF SYSTEMS: Constitutional: No fevers, chills, sweats, or change in appetite.   Some fatigue Eyes: No visual changes, double vision, eye pain Ear, nose and throat: No hearing loss, ear pain, nasal congestion, sore throat Cardiovascular: No chest pain, palpitations Respiratory: No shortness of breath at rest or with exertion.   No wheezes GastrointestinaI: No nausea, vomiting, diarrhea,  abdominal pain, fecal incontinence Genitourinary: No dysuria, urinary retention or frequency.  No nocturia. Musculoskeletal: No neck pain, back pain Integumentary: No rash, pruritus, skin lesions Neurological: as above Psychiatric: No depression at this time.  No anxiety Endocrine: No palpitations, diaphoresis, change in appetite, change in weigh or increased thirst Hematologic/Lymphatic: No anemia, purpura, petechiae. Allergic/Immunologic: No itchy/runny eyes, nasal congestion, recent allergic reactions, rashes  ALLERGIES: Allergies  Allergen  Reactions  . Shellfish Allergy Anaphylaxis    HOME MEDICATIONS:  Current Outpatient Medications:  .  Cholecalciferol (VITAMIN D PO), Take 7,000 Units by mouth daily., Disp: , Rfl:  .  Dimethyl Fumarate 240 MG CPDR, Take 1 capsule by mouth twice daily, Disp: 60 capsule, Rfl: 11 .  lisdexamfetamine (VYVANSE) 40 MG capsule, Take 1 capsule (40 mg total) by mouth daily., Disp: 30 capsule, Rfl: 0 .  montelukast (SINGULAIR) 10 MG tablet, Take 10 mg by mouth at bedtime., Disp: , Rfl:  .  ondansetron (ZOFRAN ODT) 4 MG disintegrating tablet, Take 1 tablet twice daily as needed, Disp: 10 tablet, Rfl: 2 .  sertraline (ZOLOFT) 50 MG tablet, TAKE 1 TABLET(50 MG) BY MOUTH DAILY, Disp: 30 tablet, Rfl: 11 .  rizatriptan (MAXALT) 10 MG tablet, Take 1 tablet (10 mg total) by mouth as needed for migraine. May repeat in 2 hours if needed, Disp: 10 tablet, Rfl: 11  PAST MEDICAL HISTORY: Past Medical History:  Diagnosis Date  . Anxiety   . Dysmenorrhea   . Multiple sclerosis (HCC)   . Seizures (HCC)   . Vision abnormalities     PAST SURGICAL HISTORY: Past Surgical History:  Procedure Laterality Date  . DILATION AND EVACUATION N/A 12/18/2013   Procedure: DILATATION AND EVACUATION Ultrasound guided;  Surgeon: Dorien Chihuahua. Richardson Dopp, MD;  Location: [facility];  Service: Gynecology;  Laterality: N/A;  . TONSILLECTOMY      FAMILY HISTORY: Family History  Problem Relation Age of Onset  . Liver cancer Mother   . Heart disease Father   . AAA (abdominal aortic aneurysm) Father   . Hypertension Father   . High Cholesterol Father   . Atrial fibrillation Father   . Healthy Brother     SOCIAL HISTORY:  Social History   Socioeconomic History  . Marital status: Married    Spouse name: Not on file  . Number of children: Not on file  . Years of education: Not on file  . Highest education level: Not on file  Occupational History  . Not on file  Tobacco Use  . Smoking status: Current Every Day Smoker  . Smokeless  tobacco: Never Used  Substance and Sexual Activity  . Alcohol use: Yes    Comment: seldom  . Drug use: No  . Sexual activity: Yes  Other Topics Concern  . Not on file  Social History Narrative  . Not on file   Social Determinants of Health   Financial Resource Strain:   . Difficulty of Paying Living Expenses:   Food Insecurity:   . Worried About Programme researcher, broadcasting/film/video in the Last Year:   . Barista in the Last Year:   Transportation Needs:   . Freight forwarder (Medical):   Marland Kitchen Lack of Transportation (Non-Medical):   Physical Activity:   . Days of Exercise per Week:   . Minutes of Exercise per Session:   Stress:   . Feeling of Stress :   Social Connections:   . Frequency of Communication with Friends and Family:   .  Frequency of Social Gatherings with Friends and Family:   . Attends Religious Services:   . Active Member of Clubs or Organizations:   . Attends Banker Meetings:   Marland Kitchen Marital Status:   Intimate Partner Violence:   . Fear of Current or Ex-Partner:   . Emotionally Abused:   Marland Kitchen Physically Abused:   . Sexually Abused:      PHYSICAL EXAM  Vitals:   05/20/19 1558  BP: 110/78  Pulse: 81  Temp: (!) 97.2 F (36.2 C)  SpO2: 98%  Weight: 172 lb 9.6 oz (78.3 kg)  Height: 5\' 2"  (1.575 m)    Body mass index is 31.57 kg/m.   General: The patient is well-developed and well-nourished and in no acute distress   Neurologic Exam  Mental status: The patient is alert and oriented x 3 at the time of the examination. The patient has apparent normal recent and remote memory, with an apparently normal attention span and concentration ability.   Speech is normal.  Cranial nerves: Extraocular movements are full.  Facial strength and sensation was normal.  Trapezius strength was normal.    No obvious hearing deficits are noted.  Motor:  Muscle bulk is normal.   Tone is normal. Strength is  5 / 5 in all 4 extremities.   Sensory: She had intact  sensation to touch and vibration in the arms and legs.  Coordination: Finger-nose-finger and heel-to-shin is performed well..  Gait and station: Station is normal.  Gait is normal.  Tandem gait is mildly wide.  Romberg is negative.   Reflexes: Deep tendon reflexes are symmetric.  Deep tendon reflexes are increased at the knees with spread.  DTRs increased at the ankles but no clonus.    ASSESSMENT AND PLAN  Multiple sclerosis (HCC) - Plan: CBC with Differential/Platelet, Hepatic function panel  High risk medication use - Plan: CBC with Differential/Platelet, Hepatic function panel  Other fatigue  History of seizures  Attention deficit disorder (ADD) without hyperactivity  Migraine without aura and without status migrainosus, not intractable  Depression, unspecified depression type   1.   Generic dimethyl fumarate has a very high co-pay for her.  We will see if we can get Vumerity covered.  Additionally, she may have better GI tolerance.  Check lab work 2.  Continue Vyvanse but increase to 40 mg.  We discussed a different antidepressant if mood worsens. 3.   Stay active and exercise as tolerated. 4.  Rizatriptan as needed headache 5.   Return in 6 months or sooner if there are new or worsening neurologic symptoms.   Steve Gregg A. Epimenio Foot, MD, PhD, FAAN Certified in Neurology, Clinical Neurophysiology, Sleep Medicine, Pain Medicine and Neuroimaging Director, [facility]  [facility] [address], [address] [place], [place] [address] [phone]

## 2019-05-21 LAB — CBC WITH DIFFERENTIAL/PLATELET
Basophils Absolute: 0.1 10*3/uL (ref 0.0–0.2)
Basos: 1 %
EOS (ABSOLUTE): 0.1 10*3/uL (ref 0.0–0.4)
Eos: 1 %
Hematocrit: 42.3 % (ref 34.0–46.6)
Hemoglobin: 14.3 g/dL (ref 11.1–15.9)
Immature Grans (Abs): 0 10*3/uL (ref 0.0–0.1)
Immature Granulocytes: 0 %
Lymphocytes Absolute: 3.6 10*3/uL — ABNORMAL HIGH (ref 0.7–3.1)
Lymphs: 35 %
MCH: 29.8 pg (ref 26.6–33.0)
MCHC: 33.8 g/dL (ref 31.5–35.7)
MCV: 88 fL (ref 79–97)
Monocytes Absolute: 0.6 10*3/uL (ref 0.1–0.9)
Monocytes: 6 %
Neutrophils Absolute: 5.8 10*3/uL (ref 1.4–7.0)
Neutrophils: 57 %
Platelets: 316 10*3/uL (ref 150–450)
RBC: 4.8 x10E6/uL (ref 3.77–5.28)
RDW: 11.9 % (ref 11.7–15.4)
WBC: 10.1 10*3/uL (ref 3.4–10.8)

## 2019-05-21 LAB — HEPATIC FUNCTION PANEL
ALT: 8 IU/L (ref 0–32)
AST: 11 IU/L (ref 0–40)
Albumin: 4.5 g/dL (ref 3.8–4.8)
Alkaline Phosphatase: 56 IU/L (ref 39–117)
Bilirubin Total: 0.2 mg/dL (ref 0.0–1.2)
Bilirubin, Direct: 0.06 mg/dL (ref 0.00–0.40)
Total Protein: 6.5 g/dL (ref 6.0–8.5)

## 2019-05-24 NOTE — Telephone Encounter (Signed)
Received fax from [employer] that they received start form. They have shipped Vumerity via [place] program [facility]). PA is needed via pt insurance. I will work on this.

## 2019-05-24 NOTE — Telephone Encounter (Signed)
Submitted PA Vumerity on CMM. Key: [id number]. Waiting on determination from [facility].

## 2019-05-26 NOTE — Telephone Encounter (Signed)
PA denied. Submitted appeal letter to [facility] appeals department at [phone]. Received fax confirmation. Waiting on determination.  Tried submitted to [id number] and [phone] on 05/25/19 multiple times but kept getting faxed faxes. I called [employer] today who advised their system for faxes are down right now and requested I forward to supervisor via fax at [phone].

## 2019-06-01 NOTE — Telephone Encounter (Signed)
Called to check on status of appeal. Spoke with Rachael. She states they received no appeal letter faxed on 05/26/19. She asked that I re-fax to her at [phone] attn: Rachael ref# [id number]. If she does not receive, she will have a supervisor follow up with our office by phone. I re-faxed, received fax confirmation. Waiting on determination.

## 2019-06-09 NOTE — Telephone Encounter (Signed)
Called [employer] to check on status of appeal. Spoke with Boneta Lucks. She confirmed appeal letter received 06/02/19. It is currently still under review. They have 30 days to review. Document [id number]. Ref# for call: [id number]. I sent [employer] (Crystal P.) an update to let her know where things were at.

## 2019-06-17 ENCOUNTER — Other Ambulatory Visit: Payer: Self-pay

## 2019-06-17 MED ORDER — LISDEXAMFETAMINE DIMESYLATE 40 MG PO CAPS: 40.0000 mg | ORAL_CAPSULE | Freq: Every day | ORAL | 0 refills | Status: DC

## 2019-07-07 NOTE — Telephone Encounter (Signed)
Called [employer] at [phone] and spoke with Leonette Most. Asked for update on appeal. Case# [id number]. Ref# for call: [id number]. He states appeal approved as of 06/28/19. He could not provide the effective dates. He transferred me. I spoke with Jae Dire. She states it is not approved, they are needing additional information. Letter sent 06/28/19 requesting further info. Advised we never received this. She will re-fax to [place] at [phone] attn Zian Delair,RN. Call ref# [id number].

## 2019-07-08 NOTE — Telephone Encounter (Signed)
I called [employer]. Spoke with Abby. [employer] has closed the appeal on their end and sent it to [facility] for review. I was transferred to [facility]. I spoke with Belinda Fisher. She reports that on 07/01/2019 they received the appeal. She has marked the appeal has expedited and we should have a determination in 5-10 days. We can call [phone] for an update if needed. Case ID: [id number]

## 2019-07-15 NOTE — Telephone Encounter (Signed)
I called [employer]. I spent 20 minutes on the phone with them while they reviewed the case. The determination is not yet finalized. I was advised to call back next week. Can call [phone], Case ID: [id number].

## 2019-07-16 ENCOUNTER — Other Ambulatory Visit: Payer: Self-pay

## 2019-07-19 MED ORDER — LISDEXAMFETAMINE DIMESYLATE 40 MG PO CAPS: 40.0000 mg | ORAL_CAPSULE | Freq: Every day | ORAL | 0 refills | Status: DC

## 2019-07-19 NOTE — Telephone Encounter (Signed)
I called [employer]. Spoke with Thayer Ohm. Call ref # [id number]. He advised me that the determination will not be ready until tomorrow afternoon.

## 2019-07-20 NOTE — Telephone Encounter (Signed)
I called [employer]. Spoke with [employer]. The denial was upheld. They are going to fax me the denial letter.

## 2019-07-26 NOTE — Telephone Encounter (Signed)
I still have not received the appeal denial via fax. I called [employer]. Spoke with Vernona Rieger, call ID: [phone]. They again tell me that the appeal was denied. I asked her to please fax me this documentation. She will fax it to my direct fax at [phone] and I should receive it today.

## 2019-07-26 NOTE — Telephone Encounter (Signed)
Received this notice from Crystal: "Good news is the patient has received her second quickstart shipment, and we have two more (month shipments) available to provide to her.  Just keep me updated & if there is ever anything I can do to help, please let me know!!!"

## 2019-07-26 NOTE — Telephone Encounter (Addendum)
Received a 081 page "Certificate of Coverage" policy fax from [facility] that does not include any specific information regarding the appeal denial for vumerity.  I called [employer] again. Call ID: [phone]. Spoke with Brayton Caves. She reports that the appeal does not have a decision yet. I advised her that I have been told twice that the appeal was denied and that I would be faxed a determination but have yet to actually receive this. She said she would transfer me pharmacy appeals dept. I was not transferred and the call ended.  I called [employer] again. Call ID: [phone]. Spoke with [employer]. She tells me that the appeal has been "redirected" and there is not a decision yet. The case is NOT pending and she cannot tell me where the case was "redirected" to because there are no notes. She was told me that I need to speak with the pharmacy benefit manager. This phone call lasted 30 minutes. I was transferred to [facility], spoke with Hansel Starling. She was unable to help me. I was transferred to [facility] in Prior Authorizations department. She informed me that the appeal was received but was sent to the wrong department on their end and was cancelled. She is unsure why the appeal was cancelled. She recommended completing another urgent PA and then if the PA gets denied again we can submit another urgent appeal. She apologized on their behalf that the appeal was handled incorrectly on their end. The urgent PA should have a determination in 3 days. [id number]. Can call [phone]. The urgent fax number for appeals is [phone]. This was an hour long phone call.

## 2019-07-27 NOTE — Telephone Encounter (Signed)
Faxed urgent appeal for vumerity to [facility]. Received a receipt of confirmation.

## 2019-07-27 NOTE — Telephone Encounter (Signed)
PA for vumerity 231mg  capsules was denied. Pt has not tried avonex, betaseron, plegridy, aubagio, [place], gilenya, zeposia, or kesimpta.

## 2019-08-02 NOTE — Telephone Encounter (Signed)
I called [facility]. Spoke with Rayfield Citizen. The denial for vumerity was overturned and is now approved from 07/27/2019-07/26/2020. Appeal #: [id number].  I have informed Crystal at [employer].

## 2019-08-09 NOTE — Telephone Encounter (Signed)
Received this notice from Crystal at [employer]: "the pharmacy is having issues processing the patient's copay information. They are receiving a exclusion rejection even though the appeal was approved. We are working with them to resolve this - I will keep you updated!"

## 2019-08-10 NOTE — Telephone Encounter (Signed)
Received notification from [employer] that pt's vumerity has shipped from [place].  The phone and fax for dispensing pharmacy and point of contact for future refills:  Phone: [phone] Fax: [phone].

## 2019-08-10 NOTE — Addendum Note (Signed)
Addended by: Geronimo Running A on: 08/10/2019 03:40 PM   Modules accepted: Orders

## 2019-08-17 ENCOUNTER — Other Ambulatory Visit: Payer: Self-pay

## 2019-08-17 MED ORDER — LISDEXAMFETAMINE DIMESYLATE 40 MG PO CAPS: 40.0000 mg | ORAL_CAPSULE | Freq: Every day | ORAL | 0 refills | Status: DC

## 2019-09-20 ENCOUNTER — Other Ambulatory Visit: Payer: Self-pay | Admitting: Neurology

## 2019-09-20 MED ORDER — AMPHETAMINE-DEXTROAMPHETAMINE 20 MG PO TABS
20.0000 mg | ORAL_TABLET | Freq: Two times a day (BID) | ORAL | 0 refills | Status: DC
Start: 2019-09-20 — End: 2019-10-20

## 2019-10-20 ENCOUNTER — Other Ambulatory Visit: Payer: Self-pay

## 2019-10-20 MED ORDER — AMPHETAMINE-DEXTROAMPHETAMINE 20 MG PO TABS: 20.0000 mg | ORAL_TABLET | Freq: Two times a day (BID) | ORAL | 0 refills | Status: DC

## 2019-11-16 ENCOUNTER — Other Ambulatory Visit: Payer: Self-pay

## 2019-11-16 ENCOUNTER — Other Ambulatory Visit: Payer: Self-pay | Admitting: Neurology

## 2019-11-16 MED ORDER — AMPHETAMINE-DEXTROAMPHETAMINE 20 MG PO TABS: 20.0000 mg | ORAL_TABLET | Freq: Two times a day (BID) | ORAL | 0 refills | Status: DC

## 2019-11-23 ENCOUNTER — Encounter: Payer: Self-pay | Admitting: Neurology

## 2019-11-23 ENCOUNTER — Ambulatory Visit: Payer: 59 | Admitting: Neurology

## 2019-11-23 VITALS — BP 141/81 | HR 85 | Ht 62.0 in | Wt 169.0 lb

## 2019-11-23 DIAGNOSIS — E559 Vitamin D deficiency, unspecified: Secondary | ICD-10-CM

## 2019-11-23 DIAGNOSIS — Z79899 Other long term (current) drug therapy: Secondary | ICD-10-CM | POA: Diagnosis not present

## 2019-11-23 DIAGNOSIS — Z87898 Personal history of other specified conditions: Secondary | ICD-10-CM

## 2019-11-23 DIAGNOSIS — F988 Other specified behavioral and emotional disorders with onset usually occurring in childhood and adolescence: Secondary | ICD-10-CM

## 2019-11-23 DIAGNOSIS — G35 Multiple sclerosis: Secondary | ICD-10-CM

## 2019-11-23 DIAGNOSIS — R200 Anesthesia of skin: Secondary | ICD-10-CM | POA: Insufficient documentation

## 2019-11-23 DIAGNOSIS — G43009 Migraine without aura, not intractable, without status migrainosus: Secondary | ICD-10-CM | POA: Diagnosis not present

## 2019-11-23 NOTE — Progress Notes (Signed)
[facility]  PATIENT: Chelsea Gentry DOB: [birth year]  REFERRING DOCTOR OR PCP:  Referred by Dr. Hyacinth Meeker ([facility]);   Henreitta Leber, PA-C ([facility]) SOURCE: patient, notes from Dr. Hyacinth Meeker, labs and imaging reports since 2016,   _________________________________   HISTORICAL  CHIEF COMPLAINT:  Chief Complaint  Patient presents with  . Follow-up    RM 12. Last seen 05/20/19.   . Multiple Sclerosis    On Vumerity. Adderall for fatigue.   Marland Kitchen Headache    Takes maxalt prn.    HISTORY OF PRESENT ILLNESS:   Chelsea Gentry is a 39 y.o. woman with relapsing remitting multiple sclerosis diagnosed in 2016   Update 11/23/2019: She switched to Vumerity and tolerates it well.   She feels that she has been stable over the past year. She was switched from Tecfidera to generic DMF but due to copay issues switched to Vumerity.   She tolerates it well.   She has no exacerbations.      Gait and balance are doing well.  She has no trouble with stairs.  She has numbness in both hands.   She gets pain when she writes.   She wears wrist splints at night.   She wakes up with pain and numbness some nights.  She sometimes has to reposition to get comfortable.   This started 2 years ago but is worse the past few months.  Wrist splints have not helped.   Vision is stable.   Bladder has occasioanal stress incontinence.   She has never taken anything for it and would prefer to hold off for now.      She has some fatigue.  She has some depression and anxiety.   She is unsure if she gets much benefit from Zoloft,    She has some apathy.    Adderall 20 mg IR bid has helped.  She has migraine headaches, generally better this year.   Rizatriptan helps .  She gets nausea at times.     MS History: She was diagnosed with MS in October 2016. In retrospect, about 6 or 7 years ago she had numbness in her hands more than her feet that was moderate for several weeks before it improved. At that  time, she was found to have a low vitamin D and that was blamed for the incident.  In late September 2016, she presented with left visual blurring.    She had an MRI of the brain consistent with MS.   She received po steroids and did better    Her headaches associated with the ON also improved.      She had 5 seizures over 3-4 years around ages [age] but none the past 14-15 years.   Poor sleep may have triggered some of the seizures.   One occurred after taking a diet energy pill.      IMAGING  MRIs of the brain from 10/11/2014, 10/13/2014 and 05/20/2016. The MRIs from October 2016 showed enhancement of the left optic nerve consistent with optic neuritis. Additionally, there were multiple T2/FLAIR hyperintense foci in the periventricular, juxtacortical and deep white matter and also one small one in the right cerebellar hemisphere. These were consistent with a diagnosis of multiple sclerosis. The MRI from 05/20/2016 shows the chronic lesions and additionally shows at least 6 new lesions in the periventricular, deep and juxtacortical white matter with 2 enhancing lesions.    MRI of the cervical spine 08/22/2017 showed a focus to the right  at [facility].and possible focus at T1.    MRI of the brain 08/24/2017 showed  Multiple T2/FLAIR hyperintense foci in the hemispheres and the pattern and configuration consistent with chronic demyelinating plaque associated with multiple sclerosis.  None of the foci appears to be acute.  When compared to the MRI dated 10/11/2014, there are a couple more foci on the current MRI.  MRI of the brain 12/18/2018 showed multiple T2/flair hyperintense foci in the hemispheres and 1 in the upper spinal cord in a pattern and configuration consistent with chronic demyelinating plaque associated with multiple sclerosis.  None of the foci appears to be acute.  Compared to the MRI dated 08/22/2017, there are no new lesions.    REVIEW OF SYSTEMS: Constitutional: No fevers, chills, sweats, or  change in appetite.   Some fatigue Eyes: No visual changes, double vision, eye pain Ear, nose and throat: No hearing loss, ear pain, nasal congestion, sore throat Cardiovascular: No chest pain, palpitations Respiratory: No shortness of breath at rest or with exertion.   No wheezes GastrointestinaI: No nausea, vomiting, diarrhea, abdominal pain, fecal incontinence Genitourinary: No dysuria, urinary retention or frequency.  No nocturia. Musculoskeletal: No neck pain, back pain Integumentary: No rash, pruritus, skin lesions Neurological: as above Psychiatric: No depression at this time.  No anxiety Endocrine: No palpitations, diaphoresis, change in appetite, change in weigh or increased thirst Hematologic/Lymphatic: No anemia, purpura, petechiae. Allergic/Immunologic: No itchy/runny eyes, nasal congestion, recent allergic reactions, rashes  ALLERGIES: Allergies  Allergen Reactions  . Shellfish Allergy Anaphylaxis    HOME MEDICATIONS:  Current Outpatient Medications:  .  amphetamine-dextroamphetamine (ADDERALL) 20 MG tablet, Take 1 tablet (20 mg total) by mouth 2 (two) times daily., Disp: 60 tablet, Rfl: 0 .  Cholecalciferol (VITAMIN D PO), Take 7,000 Units by mouth daily., Disp: , Rfl:  .  Diroximel Fumarate (VUMERITY) 231 MG CPDR, Take 462 mg by mouth in the morning and at bedtime., Disp: , Rfl:  .  montelukast (SINGULAIR) 10 MG tablet, Take 10 mg by mouth at bedtime., Disp: , Rfl:  .  ondansetron (ZOFRAN ODT) 4 MG disintegrating tablet, Take 1 tablet twice daily as needed, Disp: 10 tablet, Rfl: 2 .  rizatriptan (MAXALT) 10 MG tablet, Take 1 tablet (10 mg total) by mouth as needed for migraine. May repeat in 2 hours if needed, Disp: 10 tablet, Rfl: 11 .  sertraline (ZOLOFT) 50 MG tablet, TAKE 1 TABLET(50 MG) BY MOUTH DAILY, Disp: 30 tablet, Rfl: 11  PAST MEDICAL HISTORY: Past Medical History:  Diagnosis Date  . Anxiety   . Dysmenorrhea   . Multiple sclerosis (HCC)   . Seizures  (HCC)   . Vision abnormalities     PAST SURGICAL HISTORY: Past Surgical History:  Procedure Laterality Date  . DILATION AND EVACUATION N/A 12/18/2013   Procedure: DILATATION AND EVACUATION Ultrasound guided;  Surgeon: Dorien Chihuahua. Richardson Dopp, MD;  Location: [facility];  Service: Gynecology;  Laterality: N/A;  . TONSILLECTOMY      FAMILY HISTORY: Family History  Problem Relation Age of Onset  . Liver cancer Mother   . Heart disease Father   . AAA (abdominal aortic aneurysm) Father   . Hypertension Father   . High Cholesterol Father   . Atrial fibrillation Father   . Healthy Brother     SOCIAL HISTORY:  Social History   Socioeconomic History  . Marital status: Married    Spouse name: Not on file  . Number of children: Not on file  . Years of  education: Not on file  . Highest education level: Not on file  Occupational History  . Not on file  Tobacco Use  . Smoking status: Current Every Day Smoker  . Smokeless tobacco: Never Used  Substance and Sexual Activity  . Alcohol use: Yes    Comment: seldom  . Drug use: No  . Sexual activity: Yes  Other Topics Concern  . Not on file  Social History Narrative  . Not on file   Social Determinants of Health   Financial Resource Strain:   . Difficulty of Paying Living Expenses: Not on file  Food Insecurity:   . Worried About Programme researcher, broadcasting/film/video in the Last Year: Not on file  . Ran Out of Food in the Last Year: Not on file  Transportation Needs:   . Lack of Transportation (Medical): Not on file  . Lack of Transportation (Non-Medical): Not on file  Physical Activity:   . Days of Exercise per Week: Not on file  . Minutes of Exercise per Session: Not on file  Stress:   . Feeling of Stress : Not on file  Social Connections:   . Frequency of Communication with Friends and Family: Not on file  . Frequency of Social Gatherings with Friends and Family: Not on file  . Attends Religious Services: Not on file  . Active Member of Clubs or  Organizations: Not on file  . Attends Banker Meetings: Not on file  . Marital Status: Not on file  Intimate Partner Violence:   . Fear of Current or Ex-Partner: Not on file  . Emotionally Abused: Not on file  . Physically Abused: Not on file  . Sexually Abused: Not on file     PHYSICAL EXAM  Vitals:   11/23/19 1540  BP: (!) 141/81  Pulse: 85  SpO2: (!) 8%  Weight: 169 lb (76.7 kg)  Height: 5\' 2"  (1.575 m)    Body mass index is 30.91 kg/m.   General: The patient is well-developed and well-nourished and in no acute distress   Neurologic Exam  Mental status: The patient is alert and oriented x 3 at the time of the examination. The patient has apparent normal recent and remote memory, with an apparently normal attention span and concentration ability.   Speech is normal.  Cranial nerves: Extraocular movements are full.  Facial strength and sensation was normal.  Trapezius strength was normal.    No obvious hearing deficits are noted.  Motor:  Muscle bulk is normal.   Tone is normal. Strength is  5 / 5 in all 4 extremities.   Sensory: Intact sensation to touch and vibration in hands and legs.  No Tinels signs  Coordination: Finger-nose-finger and heel-to-shin is performed well..  Gait and station: Station is normal.  Gait is normal.  Tandem gait is mildly wide.  Romberg is negative.   Reflexes: Deep tendon reflexes are symmetric.  Deep tendon reflexes are increased at the knees with spread.  DTRs increased at the ankles but no clonus.    ASSESSMENT AND PLAN  Multiple sclerosis (HCC) - Plan: MR BRAIN WO CONTRAST, MR CERVICAL SPINE WO CONTRAST, CBC with Differential/Platelet  Bilateral hand numbness - Plan: NCV with EMG(electromyography), MR BRAIN WO CONTRAST  Migraine without aura and without status migrainosus, not intractable  High risk medication use - Plan: CBC with Differential/Platelet  Attention deficit disorder (ADD) without  hyperactivity  History of seizures  Vitamin D deficiency - Plan: VITAMIN D 25 Hydroxy (Vit-D  Deficiency, Fractures)    1.   Continue vumerity.  Check lab work.  Check MRI to determine if any subclinical progression and to evaluate for hand numbnes 2.  Continue Adderall 20 mg po bid.   Consider change Zoloft to Cymbalta. 3.   Stay active and exercise as tolerated. 4.   Rizatriptan as needed headache 5.   Return in 6 months or sooner if there are new or worsening neurologic symptoms.   Seve Monette A. Epimenio Foot, MD, PhD, FAAN Certified in Neurology, Clinical Neurophysiology, Sleep Medicine, Pain Medicine and Neuroimaging Director, [facility] at [facility]  [facility] [address] [place], [place] [address] [phone]

## 2019-11-24 ENCOUNTER — Telehealth: Payer: Self-pay | Admitting: Neurology

## 2019-11-24 LAB — CBC WITH DIFFERENTIAL/PLATELET
Basophils Absolute: 0.1 10*3/uL (ref 0.0–0.2)
Basos: 1 %
EOS (ABSOLUTE): 0.1 10*3/uL (ref 0.0–0.4)
Eos: 2 %
Hematocrit: 41.9 % (ref 34.0–46.6)
Hemoglobin: 14.3 g/dL (ref 11.1–15.9)
Immature Grans (Abs): 0 10*3/uL (ref 0.0–0.1)
Immature Granulocytes: 0 %
Lymphocytes Absolute: 2.5 10*3/uL (ref 0.7–3.1)
Lymphs: 29 %
MCH: 30.1 pg (ref 26.6–33.0)
MCHC: 34.1 g/dL (ref 31.5–35.7)
MCV: 88 fL (ref 79–97)
Monocytes Absolute: 0.7 10*3/uL (ref 0.1–0.9)
Monocytes: 8 %
Neutrophils Absolute: 5.2 10*3/uL (ref 1.4–7.0)
Neutrophils: 60 %
Platelets: 265 10*3/uL (ref 150–450)
RBC: 4.75 x10E6/uL (ref 3.77–5.28)
RDW: 11.7 % (ref 11.7–15.4)
WBC: 8.6 10*3/uL (ref 3.4–10.8)

## 2019-11-24 LAB — VITAMIN D 25 HYDROXY (VIT D DEFICIENCY, FRACTURES): Vit D, 25-Hydroxy: 64.6 ng/mL (ref 30.0–100.0)

## 2019-11-24 NOTE — Telephone Encounter (Signed)
[employer] Berkley Harvey: [id number] & [phone] (exp. 11/24/19 to 01/08/20) order sent to GI . They will reach out to the patient to schedule.

## 2019-12-17 ENCOUNTER — Other Ambulatory Visit: Payer: Self-pay

## 2019-12-20 MED ORDER — AMPHETAMINE-DEXTROAMPHETAMINE 20 MG PO TABS
20.0000 mg | ORAL_TABLET | Freq: Two times a day (BID) | ORAL | 0 refills | Status: DC
Start: 2019-12-20 — End: 2020-01-18

## 2019-12-28 ENCOUNTER — Encounter: Payer: Self-pay | Admitting: Neurology

## 2019-12-28 ENCOUNTER — Encounter: Payer: 59 | Admitting: Neurology

## 2020-01-18 ENCOUNTER — Other Ambulatory Visit: Payer: Self-pay

## 2020-01-18 MED ORDER — AMPHETAMINE-DEXTROAMPHETAMINE 20 MG PO TABS
20.0000 mg | ORAL_TABLET | Freq: Two times a day (BID) | ORAL | 0 refills | Status: DC
Start: 2020-01-18 — End: 2020-02-21

## 2020-02-21 ENCOUNTER — Other Ambulatory Visit: Payer: Self-pay

## 2020-02-21 MED ORDER — AMPHETAMINE-DEXTROAMPHETAMINE 20 MG PO TABS
20.0000 mg | ORAL_TABLET | Freq: Two times a day (BID) | ORAL | 0 refills | Status: DC
Start: 2020-02-21 — End: 2020-03-23

## 2020-02-24 ENCOUNTER — Telehealth: Payer: Self-pay | Admitting: Neurology

## 2020-02-24 NOTE — Telephone Encounter (Signed)
[employer] auth: [id number] & [id number] (exp. 02/24/20 to 04/09/20) order sent to GI they will reach out to the patient to schedule.

## 2020-03-09 ENCOUNTER — Other Ambulatory Visit: Payer: Self-pay | Admitting: *Deleted

## 2020-03-09 MED ORDER — ALPRAZOLAM 0.5 MG PO TABS: ORAL_TABLET | ORAL | 0 refills | Status: DC

## 2020-03-17 ENCOUNTER — Other Ambulatory Visit: Payer: Self-pay

## 2020-03-17 ENCOUNTER — Ambulatory Visit
Admission: RE | Admit: 2020-03-17 | Discharge: 2020-03-17 | Disposition: A | Discharge status: Home / Self Care | Payer: 59 | Source: Ambulatory Visit | Attending: Neurology | Admitting: Neurology

## 2020-03-17 DIAGNOSIS — G35 Multiple sclerosis: Secondary | ICD-10-CM | POA: Diagnosis not present

## 2020-03-17 DIAGNOSIS — R200 Anesthesia of skin: Secondary | ICD-10-CM

## 2020-03-23 ENCOUNTER — Other Ambulatory Visit: Payer: Self-pay

## 2020-03-23 MED ORDER — AMPHETAMINE-DEXTROAMPHETAMINE 20 MG PO TABS
20.0000 mg | ORAL_TABLET | Freq: Two times a day (BID) | ORAL | 0 refills | Status: DC
Start: 2020-03-23 — End: 2020-05-24

## 2020-03-24 ENCOUNTER — Other Ambulatory Visit: Payer: Self-pay | Admitting: Neurology

## 2020-04-17 ENCOUNTER — Other Ambulatory Visit: Payer: Self-pay | Admitting: Neurology

## 2020-04-17 MED ORDER — VORTIOXETINE HBR 10 MG PO TABS: 10.0000 mg | ORAL_TABLET | Freq: Every day | ORAL | 5 refills | Status: DC

## 2020-05-11 ENCOUNTER — Telehealth: Payer: Self-pay | Admitting: Neurology

## 2020-05-11 DIAGNOSIS — G35 Multiple sclerosis: Secondary | ICD-10-CM

## 2020-05-11 MED ORDER — VUMERITY 231 MG PO CPDR: 2.0000 | DELAYED_RELEASE_CAPSULE | Freq: Two times a day (BID) | ORAL | 11 refills | Status: DC

## 2020-05-11 NOTE — Telephone Encounter (Signed)
E-scribed refill as requested. 

## 2020-05-11 NOTE — Telephone Encounter (Signed)
[facility] Jonny Ruiz) request refill Diroximel Fumarate (VUMERITY) 231 MG CPDR at [facility]

## 2020-05-23 NOTE — Progress Notes (Signed)
Chief Complaint  Patient presents with  . Follow-up    RM 2, alone, pt states being stable. Still has ongoing issues with her mood and low energy.      HISTORY OF PRESENT ILLNESS: 05/24/20 ALL:  PEGGYSUE FEDERER is a 40 y.o. female here today for follow up for RRMS on Vumerity. MRI cervical spine and brain stable 03/2020. Labs have been normal.   She feels that she is doing fairly well. No new or worsening symptoms. Gait is stable.  Migraines are easily aborted with rizatriptan.   Adderall 20mg  BID helps some with fatigue. She does not always take second dose. She was recently switched to Trintellix for depression and anxiety. She has taken it for a month. She is not sure it is helping. She is hopeful to start exercising more since the weather is warmer.    She continues vitamin D supplements, 7000iu daily.    HISTORY (copied from Dr Bonnita Hollow previous note)   Jovonda Woolworth is a 40 y.o. woman with relapsing remitting multiple sclerosis diagnosed in 2016   Update 11/23/2019: She switched to Vumerity and tolerates it well.   She feels that she has been stable over the past year. She was switched from Tecfidera to generic DMF but due to copay issues switched to Vumerity.   She tolerates it well.   She has no exacerbations.      Gait and balance are doing well.  She has no trouble with stairs.  She has numbness in both hands.   She gets pain when she writes.   She wears wrist splints at night.   She wakes up with pain and numbness some nights.  She sometimes has to reposition to get comfortable.   This started 2 years ago but is worse the past few months.  Wrist splints have not helped.   Vision is stable.   Bladder has occasioanal stress incontinence.   She has never taken anything for it and would prefer to hold off for now.      She has some fatigue.  She has some depression and anxiety.   She is unsure if she gets much benefit from Zoloft,    She has some apathy.    Adderall 20 mg  IR bid has helped.  She has migraine headaches, generally better this year.   Rizatriptan helps .  She gets nausea at times.     MS History: She was diagnosed with MS in October 2016. In retrospect, about 6 or 7 years ago she had numbness in her hands more than her feet that was moderate for several weeks before it improved. At that time, she was found to have a low vitamin D and that was blamed for the incident.  In late September 2016, she presented with left visual blurring.    She had an MRI of the brain consistent with MS.   She received po steroids and did better    Her headaches associated with the ON also improved.      She had 5 seizures over 3-4 years around ages [age] but none the past 14-15 years.   Poor sleep may have triggered some of the seizures.   One occurred after taking a diet energy pill.      IMAGING  MRIs of the brain from 10/11/2014, 10/13/2014 and 05/20/2016. The MRIs from October 2016 showed enhancement of the left optic nerve consistent with optic neuritis. Additionally, there were multiple T2/FLAIR hyperintense foci in  the periventricular, juxtacortical and deep white matter and also one small one in the right cerebellar hemisphere. These were consistent with a diagnosis of multiple sclerosis. The MRI from 05/20/2016 shows the chronic lesions and additionally shows at least 6 new lesions in the periventricular, deep and juxtacortical white matter with 2 enhancing lesions.    MRI of the cervical spine 08/22/2017 showed a focus to the right at [facility].and possible focus at T1.    MRI of the brain 08/24/2017 showed Multiple T2/FLAIR hyperintense foci in the hemispheres and the pattern and configuration consistent with chronic demyelinating plaque associated with multiple sclerosis. None of the foci appears to be acute. When compared to the MRI dated 10/11/2014, there are a couple more foci on the current MRI.  MRI of the brain 12/18/2018 showed multiple T2/flair  hyperintense foci in the hemispheres and 1 in the upper spinal cord in a pattern and configuration consistent with chronic demyelinating plaque associated with multiple sclerosis. None of the foci appears to be acute. Compared to the MRI dated 08/22/2017, there are no new lesions.     REVIEW OF SYSTEMS: Out of a complete 14 system review of symptoms, the patient complains only of the following symptoms, headaches, depression, anxiety,  and all other reviewed systems are negative.    ALLERGIES: Allergies  Allergen Reactions  . Shellfish Allergy Anaphylaxis     HOME MEDICATIONS: Outpatient Medications Prior to Visit  Medication Sig Dispense Refill  . Cholecalciferol (VITAMIN D PO) Take 7,000 Units by mouth daily.    . Diroximel Fumarate (VUMERITY) 231 MG CPDR Take 2 capsules by mouth in the morning and at bedtime. 120 capsule 11  . montelukast (SINGULAIR) 10 MG tablet Take 10 mg by mouth at bedtime.    . ondansetron (ZOFRAN ODT) 4 MG disintegrating tablet Take 1 tablet twice daily as needed 10 tablet 2  . vortioxetine HBr (TRINTELLIX) 10 MG TABS tablet Take 1 tablet (10 mg total) by mouth daily. 30 tablet 5  . ALPRAZolam (XANAX) 0.5 MG tablet Take 1-2 tablets prior to MRI. Can take additional tablet at time of test if needed. Must have driver to and from test. Can cause drowsiness. 3 tablet 0  . amphetamine-dextroamphetamine (ADDERALL) 20 MG tablet Take 1 tablet (20 mg total) by mouth 2 (two) times daily. 60 tablet 0  . rizatriptan (MAXALT) 10 MG tablet Take 1 tablet (10 mg total) by mouth as needed for migraine. May repeat in 2 hours if needed 10 tablet 11   No facility-administered medications prior to visit.     PAST MEDICAL HISTORY: Past Medical History:  Diagnosis Date  . Anxiety   . Dysmenorrhea   . Multiple sclerosis (HCC)   . Seizures (HCC)   . Vision abnormalities      PAST SURGICAL HISTORY: Past Surgical History:  Procedure Laterality Date  . DILATION AND  EVACUATION N/A 12/18/2013   Procedure: DILATATION AND EVACUATION Ultrasound guided;  Surgeon: Dorien Chihuahua. Richardson Dopp, MD;  Location: [facility];  Service: Gynecology;  Laterality: N/A;  . TONSILLECTOMY       FAMILY HISTORY: Family History  Problem Relation Age of Onset  . Liver cancer Mother   . Heart disease Father   . AAA (abdominal aortic aneurysm) Father   . Hypertension Father   . High Cholesterol Father   . Atrial fibrillation Father   . Healthy Brother      SOCIAL HISTORY: Social History   Socioeconomic History  . Marital status: Married  Spouse name: Not on file  . Number of children: Not on file  . Years of education: Not on file  . Highest education level: Not on file  Occupational History  . Not on file  Tobacco Use  . Smoking status: Current Every Day Smoker  . Smokeless tobacco: Never Used  Substance and Sexual Activity  . Alcohol use: Yes    Comment: seldom  . Drug use: No  . Sexual activity: Yes  Other Topics Concern  . Not on file  Social History Narrative  . Not on file   Social Determinants of Health   Financial Resource Strain: Not on file  Food Insecurity: Not on file  Transportation Needs: Not on file  Physical Activity: Not on file  Stress: Not on file  Social Connections: Not on file  Intimate Partner Violence: Not on file      PHYSICAL EXAM  Vitals:   05/24/20 0753  BP: 115/80  Pulse: 85  Weight: 170 lb (77.1 kg)  Height: 5\' 2"  (1.575 m)   Body mass index is 31.09 kg/m.   Generalized: Well developed, in no acute distress  Cardiology: normal rate and rhythm, no murmur auscultated  Respiratory: clear to auscultation bilaterally    Neurological examination  Mentation: Alert oriented to time, place, history taking. Follows all commands speech and language fluent Cranial nerve II-XII: Pupils were equal round reactive to light. Extraocular movements were full, visual field were full on confrontational test. Facial sensation and  strength were normal. Head turning and shoulder shrug  were normal and symmetric. Motor: The motor testing reveals 5 over 5 strength of all 4 extremities. Good symmetric motor tone is noted throughout.  Sensory: Sensory testing is intact to soft touch on all 4 extremities. No evidence of extinction is noted.  Coordination: Cerebellar testing reveals good finger-nose-finger and heel-to-shin bilaterally.  Gait and station: Gait is normal.  Reflexes: Deep tendon reflexes are symmetric and normal bilaterally.     DIAGNOSTIC DATA (LABS, IMAGING, TESTING) - I reviewed patient records, labs, notes, testing and imaging myself where available.  Lab Results  Component Value Date   WBC 8.6 11/23/2019   HGB 14.3 11/23/2019   HCT 41.9 11/23/2019   MCV 88 11/23/2019   PLT 265 11/23/2019      Component Value Date/Time   GLUCOSE 88 06/08/2011 0928   PROT 6.5 05/20/2019 1647   ALBUMIN 4.5 05/20/2019 1647   AST 11 05/20/2019 1647   ALT 8 05/20/2019 1647   ALKPHOS 56 05/20/2019 1647   BILITOT 0.2 05/20/2019 1647   No results found for: CHOL, HDL, LDLCALC, LDLDIRECT, TRIG, CHOLHDL No results found for: 05/22/2019 No results found for: VITAMINB12 Lab Results  Component Value Date   TSH 2.720 06/19/2016    No flowsheet data found.   No flowsheet data found.   ASSESSMENT AND PLAN  40 y.o. year old female  has a past medical history of Anxiety, Dysmenorrhea, Multiple sclerosis (HCC), Seizures (HCC), and Vision abnormalities. here with     Multiple sclerosis (HCC) - Plan: CBC with Differential/Platelets, Hepatic Function Panel  High risk medication use - Plan: CBC with Differential/Platelets, Hepatic Function Panel  Other fatigue  Depression, unspecified depression type  Migraine without aura and without status migrainosus, not intractable  Attention deficit disorder (ADD) without hyperactivity  Vitamin D deficiency  Lashonne is doing well, today. We will continue Vumerity. I will  update labs. She will continue Trintellix, Adderall and rizatriptan as prescribed. PDMP appropriate. I have  discussed considering psychiatry/psychology referral if depression worsens. Healthy lifestyle habits encouraged. Regular exercise will be helpful. She will follow up with Dr Epimenio Foot in 6 months, sooner if needed.   Orders Placed This Encounter  Procedures  . CBC with Differential/Platelets  . Hepatic Function Panel     Meds ordered this encounter  Medications  . rizatriptan (MAXALT) 10 MG tablet    Sig: Take 1 tablet (10 mg total) by mouth as needed for migraine. May repeat in 2 hours if needed    Dispense:  10 tablet    Refill:  11    Regular tablets to take the place of the disintegrating tablets.    Order Specific Question:   Supervising Provider    Answer:   Anson Fret [id number]  . amphetamine-dextroamphetamine (ADDERALL) 20 MG tablet    Sig: Take 1 tablet (20 mg total) by mouth 2 (two) times daily.    Dispense:  60 tablet    Refill:  0    Order Specific Question:   Supervising Provider    Answer:   Anson Fret [[id number]]     Shawnie Dapper, MSN, FNP-C 05/24/2020, 8:20 AM  [facility] [address] [place], [place] [address] [phone]

## 2020-05-23 NOTE — Patient Instructions (Signed)
Below is our plan:  We will continue current treatment plan.   Please make sure you are staying well hydrated. I recommend 50-60 ounces daily. Well balanced diet and regular exercise encouraged. Consistent sleep schedule with 6-8 hours recommended.   Please continue follow up with care team as directed.   Follow up with Dr Sater in 6 months   You may receive a survey regarding today's visit. I encourage you to leave honest feed back as I do use this information to improve patient care. Thank you for seeing me today!      Multiple Sclerosis Multiple sclerosis (MS) is a disease of the brain, spinal cord, and optic nerves (central nervous system). It causes the body's disease-fighting (immune) system to destroy the protective covering (myelin sheath) around nerves in the brain. When this happens, signals (nerve impulses) going to and from the brain and spinal cord do not get sent properly or may not get sent at all. There are several types of MS:  Relapsing-remitting MS. This is the most common type. This causes sudden attacks of symptoms. After an attack, you may recover completely until the next attack, or some symptoms may remain permanently.  Secondary progressive MS. This usually develops after the onset of relapsing-remitting MS. Similar to relapsing-remitting MS, this type also causes sudden attacks of symptoms. Attacks may be less frequent, but symptoms slowly get worse (progress) over time.  Primary progressive MS. This causes symptoms that steadily progress over time. This type of MS does not cause sudden attacks of symptoms. The age of onset of MS varies, but it often develops between 20-40 years of age. MS is a lifelong (chronic) condition. There is no cure, but treatment can help slow down the progression of the disease. What are the causes? The cause of this condition is not known. What increases the risk? You are more likely to develop this condition if:  You are a  woman.  You have a relative with MS. However, the condition is not passed from parent to child (inherited).  You have a lack (deficiency) of vitamin D.  You smoke. MS is more common in the [place] than in the southern [place]. What are the signs or symptoms? Relapsing-remitting and secondary progressive MS cause symptoms to occur in episodes or attacks that may last weeks to months. There may be long periods between attacks in which there are almost no symptoms. Primary progressive MS causes symptoms to steadily progress after they develop. Symptoms of MS vary because of the many different ways it affects the central nervous system. The main symptoms include:  Vision problems and eye pain.  Numbness and weakness.  Inability to move your arms, hands, feet, or legs (paralysis).  Balance problems.  Shaking that you cannot control (tremors).  Muscle spasms.  Problems with thinking (cognitive changes). MS can also cause symptoms that are associated with the disease, but are not always the direct result of an MS attack. They may include:  Inability to control urination or bowel movements (incontinence).  Headaches.  Fatigue.  Inability to tolerate heat.  Emotional changes.  Depression.  Pain. How is this diagnosed? This condition is diagnosed based on:  Your symptoms.  A neurological exam. This involves checking central nervous system function, such as nerve function, reflexes, and coordination.  MRIs of the brain and spinal cord.  Lab tests, including a lumbar puncture that tests the fluid that surrounds the brain and spinal cord (cerebrospinal fluid).  Tests to measure   the electrical activity of the brain in response to stimulation (evoked potentials). How is this treated? There is no cure for MS, but medicines can help decrease the number and frequency of attacks and help relieve nuisance symptoms. Treatment options may include:  Medicines that  reduce the frequency of attacks. These medicines may be given by injection, by mouth (orally), or through an IV.  Medicines that reduce inflammation (steroids). These may provide short-term relief of symptoms.  Medicines to help control pain, depression, fatigue, or incontinence.  Nutritional counseling. Vitamin D supplements, if you have a deficiency.  Using devices to help you move around (assistive devices), such as braces, a cane, or a walker.  Physical therapy to strengthen and stretch your muscles.  Occupational therapy to help you with everyday tasks.  Alternative or complementary treatments such as exercise, massage, or acupuncture.   Follow these instructions at home:  Take over-the-counter and prescription medicines only as told by your health care provider.  Do not drive or use heavy machinery while taking prescription pain medicine.  Use assistive devices as recommended by your physical therapist or your health care provider.  Exercise as directed by your health care provider.  Eating healthy can help manage MS symptoms.  Return to your normal activities as told by your health care provider. Ask your health care provider what activities are safe for you.  Reach out for support. Share your feelings with friends, family, or a support group.  Keep all follow-up visits as told by your health care provider and therapists. This is important. Where to find more information  National Multiple Sclerosis Society: https://www.nationalmssociety.org  National Institute of Neurological Disorders and Stroke: https://www.ninds.nih.gov  National Center for Complementary and Integrative Health: https://www.nccih.nih.gov/ Contact a health care provider if:  You feel depressed.  You develop new pain or numbness.  You have tremors.  You have problems with sexual function. Get help right away if:  You develop paralysis.  You develop numbness.  You have problems with your  bladder or bowel function.  You develop double vision.  You lose vision in one or both eyes.  You develop suicidal thoughts.  You develop severe confusion. If you ever feel like you may hurt yourself or others, or have thoughts about taking your own life, get help right away. You can go to your nearest emergency department or call:  Your local emergency services (911 in the [place]  A suicide crisis helpline, such as the National Suicide Prevention Lifeline at 1-800-273-8255. This is open 24 hours a day. Summary  Multiple sclerosis (MS) is a disease of the central nervous system that causes the body's immune system to destroy the protective covering (myelin sheath) around nerves in the brain.  There are 3 types of MS: relapsing-remitting, secondary progressive, and primary progressive. Relapsing-remitting and secondary progressive MS cause symptoms to occur in episodes or attacks that may last weeks to months. Primary progressive MS causes symptoms to steadily progress after they develop.  There is no cure for MS, but medicines can help decrease the number and frequency of attacks and help relieve nuisance symptoms. Treatment may also include physical or occupational therapy.  If you develop numbness, paralysis, vision problems, or other neurological symptoms, get help right away. This information is not intended to replace advice given to you by your health care provider. Make sure you discuss any questions you have with your health care provider. Document Revised: 10/05/2019 Document Reviewed: 10/05/2019 Elsevier Patient Education  2021 Elsevier Inc.  

## 2020-05-24 ENCOUNTER — Ambulatory Visit: Payer: 59 | Admitting: Family Medicine

## 2020-05-24 ENCOUNTER — Encounter: Payer: Self-pay | Admitting: Family Medicine

## 2020-05-24 VITALS — BP 115/80 | HR 85 | Ht 62.0 in | Wt 170.0 lb

## 2020-05-24 DIAGNOSIS — G35 Multiple sclerosis: Secondary | ICD-10-CM | POA: Diagnosis not present

## 2020-05-24 DIAGNOSIS — G35D Multiple sclerosis, unspecified: Secondary | ICD-10-CM

## 2020-05-24 DIAGNOSIS — E559 Vitamin D deficiency, unspecified: Secondary | ICD-10-CM

## 2020-05-24 DIAGNOSIS — G43009 Migraine without aura, not intractable, without status migrainosus: Secondary | ICD-10-CM

## 2020-05-24 DIAGNOSIS — R5383 Other fatigue: Secondary | ICD-10-CM

## 2020-05-24 DIAGNOSIS — Z79899 Other long term (current) drug therapy: Secondary | ICD-10-CM

## 2020-05-24 DIAGNOSIS — F32A Depression, unspecified: Secondary | ICD-10-CM | POA: Diagnosis not present

## 2020-05-24 DIAGNOSIS — F988 Other specified behavioral and emotional disorders with onset usually occurring in childhood and adolescence: Secondary | ICD-10-CM

## 2020-05-24 MED ORDER — RIZATRIPTAN BENZOATE 10 MG PO TABS: 10.0000 mg | ORAL_TABLET | ORAL | 11 refills | Status: DC | PRN

## 2020-05-24 MED ORDER — AMPHETAMINE-DEXTROAMPHETAMINE 20 MG PO TABS: 20.0000 mg | ORAL_TABLET | Freq: Two times a day (BID) | ORAL | 0 refills | Status: DC

## 2020-05-24 NOTE — Progress Notes (Signed)
I have read the note, and I agree with the clinical assessment and plan.  Karmelo Bass A. Randall Rampersad, MD, PhD, FAAN Certified in Neurology, Clinical Neurophysiology, Sleep Medicine, Pain Medicine and Neuroimaging  [facility] [address], [address] [place], [place] [address] [phone]  

## 2020-05-25 LAB — HEPATIC FUNCTION PANEL
ALT: 11 IU/L (ref 0–32)
AST: 9 IU/L (ref 0–40)
Albumin: 4.6 g/dL (ref 3.8–4.8)
Alkaline Phosphatase: 51 IU/L (ref 44–121)
Bilirubin Total: 0.5 mg/dL (ref 0.0–1.2)
Bilirubin, Direct: <0.1 mg/dL (ref 0.00–0.40)
Total Protein: 6.6 g/dL (ref 6.0–8.5)

## 2020-05-25 LAB — CBC WITH DIFFERENTIAL/PLATELET
Basophils Absolute: 0.1 10*3/uL (ref 0.0–0.2)
Basos: 1 %
EOS (ABSOLUTE): 0.1 10*3/uL (ref 0.0–0.4)
Eos: 1 %
Hematocrit: 42.6 % (ref 34.0–46.6)
Hemoglobin: 14.4 g/dL (ref 11.1–15.9)
Immature Grans (Abs): 0 10*3/uL (ref 0.0–0.1)
Immature Granulocytes: 0 %
Lymphocytes Absolute: 1.6 10*3/uL (ref 0.7–3.1)
Lymphs: 25 %
MCH: 30.8 pg (ref 26.6–33.0)
MCHC: 33.8 g/dL (ref 31.5–35.7)
MCV: 91 fL (ref 79–97)
Monocytes Absolute: 0.5 10*3/uL (ref 0.1–0.9)
Monocytes: 9 %
Neutrophils Absolute: 4 10*3/uL (ref 1.4–7.0)
Neutrophils: 64 %
Platelets: 219 10*3/uL (ref 150–450)
RBC: 4.68 x10E6/uL (ref 3.77–5.28)
RDW: 12 % (ref 11.7–15.4)
WBC: 6.3 10*3/uL (ref 3.4–10.8)

## 2020-05-30 ENCOUNTER — Telehealth: Payer: Self-pay | Admitting: *Deleted

## 2020-05-30 NOTE — Telephone Encounter (Signed)
Faxed completed/signed PA Trintellix to [facility] at [phone]. Received fax confirmation. Waiting on determination.

## 2020-05-31 MED ORDER — DULOXETINE HCL 60 MG PO CPEP: 60.0000 mg | ORAL_CAPSULE | Freq: Every day | ORAL | 11 refills | Status: DC

## 2020-05-31 NOTE — Telephone Encounter (Signed)
Called pt to let her know insurance denied Trintellix. She must try and fail two of the following first: citalopram, duloxetine, fluoxetine, fluvoxamine, paroxetine, venlafaxine. Per Dr. Epimenio Foot, he recommend duloxetine 60mg  po qd. #30, 11 refills. Pt agreeable to this. She relayed that she ran out of tritellix, so she picked up zoloft and has been on that for a week. Advised per MD that she should stop zoloft and start cymbalta. She verbalized understanding. I e-scribed duloxetine.

## 2020-05-31 NOTE — Addendum Note (Signed)
Addended by: Arther Abbott on: 05/31/2020 05:19 PM   Modules accepted: Orders

## 2020-06-30 ENCOUNTER — Other Ambulatory Visit: Payer: Self-pay | Admitting: Family Medicine

## 2020-07-03 MED ORDER — AMPHETAMINE-DEXTROAMPHETAMINE 20 MG PO TABS: 20.0000 mg | ORAL_TABLET | Freq: Two times a day (BID) | ORAL | 0 refills | Status: DC

## 2020-08-21 ENCOUNTER — Telehealth: Payer: Self-pay

## 2020-08-21 NOTE — Telephone Encounter (Signed)
I submitted a PA request for Vumerity on CMM, Key: [id number]. I attached last office visit note as well as the letter of appeal used in 07/2019 to overturn the original denial.   Awaiting determination from [facility].

## 2020-08-23 ENCOUNTER — Encounter: Payer: Self-pay | Admitting: *Deleted

## 2020-08-23 NOTE — Telephone Encounter (Signed)
The request for coverage for Vumerity Cap 231mg , use as directed (120 per month), is denied. This decision is based on health plan criteria for Vumerity. This medicine is covered only if:  Both of the following: (A) Submission of medical record documenting the trial and failure (after trial of at least four weeks), contraindication, or intolerance to one of the following: (I) Avonex (interferon beta-1a). (II) Betaseron (interferon beta-1b). (III) Plegridy (peginterferon beta-1a). (IV) Aubagio (teriflunomide). (V) Mayzent (siponimod). (VI) Gilenya (fingolimod). (VII) Zeposia (ozanimod). (VIII) Kesimpta (ofatumumab).  (B) Submission of medical records documenting the trial and failure (after trial of at least four weeks), contraindication, or intolerance to Bafiertam (monomethyl fumarate).  To file an appeal, please send any written comments, documents or other relevant documentation with your appeal:  Phone: Please call the toll-free member number listed on your health plan ID card. Fax: [phone] Expedited / Urgent Fax: [phone]

## 2020-08-23 NOTE — Telephone Encounter (Signed)
Faxed appeal letter to [phone]. Received fax confirmation. Waiting on determination.

## 2020-08-23 NOTE — Telephone Encounter (Signed)
Faxed notice of denial to [employer] to see if pt qualifies for free drug. In the meantime, we will appeal decision. Appeal letter pending MD signature and then will fax for review.

## 2020-09-07 NOTE — Telephone Encounter (Signed)
Called to check on status of appeal. Spoke w/ Jessa P. Appeal approved on 08/25/20. She will fax copy of approval to [phone].

## 2020-09-08 NOTE — Telephone Encounter (Signed)
Received fax from [facility] that appeal approved until 08/25/2021. Transaction # [id number].

## 2020-09-25 ENCOUNTER — Other Ambulatory Visit: Payer: Self-pay | Admitting: Family Medicine

## 2020-09-26 MED ORDER — AMPHETAMINE-DEXTROAMPHETAMINE 20 MG PO TABS: 20.0000 mg | ORAL_TABLET | Freq: Two times a day (BID) | ORAL | 0 refills | Status: DC

## 2020-11-21 ENCOUNTER — Other Ambulatory Visit: Payer: Self-pay | Admitting: Family Medicine

## 2020-11-21 MED ORDER — AMPHETAMINE-DEXTROAMPHETAMINE 20 MG PO TABS: 20.0000 mg | ORAL_TABLET | Freq: Two times a day (BID) | ORAL | 0 refills | Status: DC

## 2020-11-21 NOTE — Telephone Encounter (Signed)
Received refill request for Adderall.  Last OV was on 05/24/20.  Next OV is scheduled for 11/23/20 .  Last RX was written on 09/26/20 for 60 tabs.   [place] [facility] has been reviewed.   Changed fill date to 11/18/222 for compliance.

## 2020-11-23 ENCOUNTER — Ambulatory Visit: Payer: 59 | Admitting: Neurology

## 2020-11-23 VITALS — BP 144/92 | HR 86 | Ht 62.0 in | Wt 175.0 lb

## 2020-11-23 DIAGNOSIS — G43009 Migraine without aura, not intractable, without status migrainosus: Secondary | ICD-10-CM | POA: Diagnosis not present

## 2020-11-23 DIAGNOSIS — Z79899 Other long term (current) drug therapy: Secondary | ICD-10-CM

## 2020-11-23 DIAGNOSIS — Z87898 Personal history of other specified conditions: Secondary | ICD-10-CM | POA: Diagnosis not present

## 2020-11-23 DIAGNOSIS — G35 Multiple sclerosis: Secondary | ICD-10-CM

## 2020-11-23 DIAGNOSIS — R200 Anesthesia of skin: Secondary | ICD-10-CM | POA: Diagnosis not present

## 2020-11-23 DIAGNOSIS — F32A Depression, unspecified: Secondary | ICD-10-CM

## 2020-11-23 NOTE — Progress Notes (Signed)
[facility]  PATIENT: Chelsea Gentry DOB: [birth year]  REFERRING DOCTOR OR PCP:  Referred by Dr. Hyacinth Meeker ([facility]);   Henreitta Leber, PA-C [facility]) SOURCE: patient, notes from Dr. Hyacinth Meeker, labs and imaging reports since 2016,   _________________________________   HISTORICAL  CHIEF COMPLAINT:  Chief Complaint  Patient presents with   Follow-up    RM 1, alone. Last seen 05/24/20. DMT-Vumerity. Numbness still present r/t MS.  Wanting to do EMG/NCS to assess CTS, never did last year d/t being sick. Wanting to get anxiety better under control.     HISTORY OF PRESENT ILLNESS:   Chelsea Gentry is a 40 y.o. woman with relapsing remitting multiple sclerosis diagnosed in 2016   Update 11/23/2020: She has been on Vumerity since 2021, switching from Tecfidera/DM due to copay issues.   She tolerates it well.   She has no exacerbations.    Existing symptoms are stable,  Gait and balance are doing well.  She has no trouble with stairs.  She has numbness in both hands, worse in the mornings, regardless of wearing a splint.    She gets pain when she writes.   She wears wrist splints at night.   She wakes up with pain and numbness some nights.  She sometimes has to reposition to get comfortable.   This started 2 years ago but is worse the past few months.  Wrist splints have not helped.   Vision is stable.   Bladder has occasioanal stress incontinence.   She has never taken anything for it and would prefer to hold off for now.     She has some fatigue.  She has some depression and anxiety.   Zoloft had not helped  Cymbalta is not helping and she will be seeing Behavioral Health.   She has some apathy.   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    Adderall 20 mg IR bid has helped.  She has migraine headaches, generally better this year.   Rizatriptan helps.    She gets nausea at times.     MS History: She was diagnosed with MS in October 2016. In retrospect, about 6  or 7 years ago she had numbness in her hands more than her feet that was moderate for several weeks before it improved. At that time, she was found to have a low vitamin D and that was blamed for the incident.  In late September 2016, she presented with left visual blurring.    She had an MRI of the brain consistent with MS.   She received po steroids and did better    Her headaches associated with the ON also improved.    She was first on Copaxone but had swithced  She had 5 seizures over 3-4 years around ages [age] but none the past 14-15 years.   Poor sleep may have triggered some of the seizures.   One occurred after taking a diet energy pill.      IMAGING  MRIs of the brain from 10/11/2014, 10/13/2014 and 05/20/2016. The MRIs from October 2016 showed enhancement of the left optic nerve consistent with optic neuritis. Additionally, there were multiple T2/FLAIR hyperintense foci in the periventricular, juxtacortical and deep white matter and also one small one in the right cerebellar hemisphere. These were consistent with a diagnosis of multiple sclerosis. The MRI from 05/20/2016 shows the chronic lesions and additionally shows at least 6 new lesions in the periventricular, deep and juxtacortical white matter  with 2 enhancing lesions.    MRI of the cervical spine 08/22/2017 showed a focus to the right at [facility].and possible focus at T1.    MRI of the brain 08/24/2017 showed  Multiple T2/FLAIR hyperintense foci in the hemispheres and the pattern and configuration consistent with chronic demyelinating plaque associated with multiple sclerosis.  None of the foci appears to be acute.  When compared to the MRI dated 10/11/2014, there are a couple more foci on the current MRI.  MRI of the brain 12/18/2018 showed multiple T2/flair hyperintense foci in the hemispheres and 1 in the upper spinal cord in a pattern and configuration consistent with chronic demyelinating plaque associated with multiple sclerosis.  None  of the foci appears to be acute.  Compared to the MRI dated 08/22/2017, there are no new lesions.  MR of the brain 03/17/2020 showed Multiple T2 hyperintense foci in the hemispheres and upper spinal cord in a pattern and configuration consistent with chronic demyelinating plaque associated with multiple sclerosis.  None of the foci appear to be acute.  Compared to the MRI dated 12/18/2018, there are no new lesions.  MRI of the cervical spine 03/17/2020 showed  T2 hyperintense focus towards the right adjacent to C2-C3 unchanged compared to the previous MRI from 08/22/2017 and consistent with a chronic demyelinating plaque associated with multiple sclerosis.      Disc protrusion at C5-C6 has progressed compared to the previous MRI but does not lead to spinal stenosis or nerve root compression   REVIEW OF SYSTEMS: Constitutional: No fevers, chills, sweats, or change in appetite.   Some fatigue Eyes: No visual changes, double vision, eye pain Ear, nose and throat: No hearing loss, ear pain, nasal congestion, sore throat Cardiovascular: No chest pain, palpitations Respiratory:  No shortness of breath at rest or with exertion.   No wheezes GastrointestinaI: No nausea, vomiting, diarrhea, abdominal pain, fecal incontinence Genitourinary:  No dysuria, urinary retention or frequency.  No nocturia. Musculoskeletal:  No neck pain, back pain Integumentary: No rash, pruritus, skin lesions Neurological: as above Psychiatric: No depression at this time.  No anxiety Endocrine: No palpitations, diaphoresis, change in appetite, change in weigh or increased thirst Hematologic/Lymphatic:  No anemia, purpura, petechiae. Allergic/Immunologic: No itchy/runny eyes, nasal congestion, recent allergic reactions, rashes  ALLERGIES: Allergies  Allergen Reactions   Shellfish Allergy Anaphylaxis    HOME MEDICATIONS:  Current Outpatient Medications:    [START ON 11/24/2020] amphetamine-dextroamphetamine (ADDERALL) 20 MG  tablet, Take 1 tablet (20 mg total) by mouth 2 (two) times daily., Disp: 60 tablet, Rfl: 0   Cholecalciferol (VITAMIN D PO), Take 7,000 Units by mouth daily., Disp: , Rfl:    Diroximel Fumarate (VUMERITY) 231 MG CPDR, Take 2 capsules by mouth in the morning and at bedtime., Disp: 120 capsule, Rfl: 11   DULoxetine (CYMBALTA) 60 MG capsule, Take 1 capsule (60 mg total) by mouth daily., Disp: 60 capsule, Rfl: 11   montelukast (SINGULAIR) 10 MG tablet, Take 10 mg by mouth at bedtime., Disp: , Rfl:    ondansetron (ZOFRAN ODT) 4 MG disintegrating tablet, Take 1 tablet twice daily as needed, Disp: 10 tablet, Rfl: 2   rizatriptan (MAXALT) 10 MG tablet, Take 1 tablet (10 mg total) by mouth as needed for migraine. May repeat in 2 hours if needed, Disp: 10 tablet, Rfl: 11  PAST MEDICAL HISTORY: Past Medical History:  Diagnosis Date   Anxiety    Dysmenorrhea    Multiple sclerosis (HCC)    Seizures (HCC)  Vision abnormalities     PAST SURGICAL HISTORY: Past Surgical History:  Procedure Laterality Date   DILATION AND EVACUATION N/A 12/18/2013   Procedure: DILATATION AND EVACUATION Ultrasound guided;  Surgeon: Dorien Chihuahua. Richardson Dopp, MD;  Location: [facility];  Service: Gynecology;  Laterality: N/A;   TONSILLECTOMY      FAMILY HISTORY: Family History  Problem Relation Age of Onset   Liver cancer Mother    Heart disease Father    AAA (abdominal aortic aneurysm) Father    Hypertension Father    High Cholesterol Father    Atrial fibrillation Father    Healthy Brother     SOCIAL HISTORY:  Social History   Socioeconomic History   Marital status: Married    Spouse name: Not on file   Number of children: Not on file   Years of education: Not on file   Highest education level: Not on file  Occupational History   Not on file  Tobacco Use   Smoking status: Every Day   Smokeless tobacco: Never  Substance and Sexual Activity   Alcohol use: Yes    Comment: seldom   Drug use: No   Sexual activity:  Yes  Other Topics Concern   Not on file  Social History Narrative   Not on file   Social Determinants of Health   Financial Resource Strain: Not on file  Food Insecurity: Not on file  Transportation Needs: Not on file  Physical Activity: Not on file  Stress: Not on file  Social Connections: Not on file  Intimate Partner Violence: Not on file     PHYSICAL EXAM  Vitals:   11/23/20 1523  BP: (!) 144/92  Pulse: 86  Weight: 175 lb (79.4 kg)  Height: 5\' 2"  (1.575 m)    Body mass index is 32.01 kg/m.   General: The patient is well-developed and well-nourished and in no acute distress   Neurologic Exam  Mental status: The patient is alert and oriented x 3 at the time of the examination. The patient has apparent normal recent and remote memory, with an apparently normal attention span and concentration ability.   Speech is normal.  Cranial nerves: Extraocular movements are full.  Facial strength and sensation was normal.  Trapezius strength was normal.    No obvious hearing deficits are noted.  Motor:  Muscle bulk is normal.   Tone is normal. Strength is  5 / 5 in all 4 extremities.   Sensory: Intact sensation to touch and vibration in hands and legs.  No Tinels signs  Coordination: She performs finger-nose-finger and heel-to-shin well.  Gait and station: Station is normal.  Gait is normal.  Tandem gait is mildly wide.  Romberg is negative.   Reflexes: Deep tendon reflexes are symmetric.  Deep tendon reflexes are increased at the knees with spread.  DTRs increased at the ankles but no clonus.    ASSESSMENT AND PLAN  Multiple sclerosis (HCC)  History of seizures  Hand numbness  Migraine without aura and without status migrainosus, not intractable  High risk medication use  Depression, unspecified depression type    1.   Continue vumerity.  Check lab work.  Check MRI in 2023 to determine if any subclinical progression and to evaluate for hand numbnes 2.   Continue Adderall 20 mg po bid.   3.   Stay active and exercise as tolerated. 4.   Rizatriptan as needed headache 5.   Return in 6 months or sooner if there are new  or worsening neurologic symptoms.   Jabriel Vanduyne A. Epimenio Foot, MD, PhD, FAAN Certified in Neurology, Clinical Neurophysiology, Sleep Medicine, Pain Medicine and Neuroimaging Director, [facility]  [facility] [address], [address] [place], [place] [address] [phone]

## 2020-11-24 ENCOUNTER — Encounter: Payer: Self-pay | Admitting: Neurology

## 2020-11-24 LAB — CBC WITH DIFFERENTIAL/PLATELET
Basophils Absolute: 0 10*3/uL (ref 0.0–0.2)
Basos: 1 %
EOS (ABSOLUTE): 0.1 10*3/uL (ref 0.0–0.4)
Eos: 2 %
Hematocrit: 39.9 % (ref 34.0–46.6)
Hemoglobin: 13.9 g/dL (ref 11.1–15.9)
Immature Grans (Abs): 0 10*3/uL (ref 0.0–0.1)
Immature Granulocytes: 0 %
Lymphocytes Absolute: 2.1 10*3/uL (ref 0.7–3.1)
Lymphs: 28 %
MCH: 30.5 pg (ref 26.6–33.0)
MCHC: 34.8 g/dL (ref 31.5–35.7)
MCV: 88 fL (ref 79–97)
Monocytes Absolute: 0.8 10*3/uL (ref 0.1–0.9)
Monocytes: 10 %
Neutrophils Absolute: 4.5 10*3/uL (ref 1.4–7.0)
Neutrophils: 59 %
Platelets: 263 10*3/uL (ref 150–450)
RBC: 4.56 x10E6/uL (ref 3.77–5.28)
RDW: 12 % (ref 11.7–15.4)
WBC: 7.5 10*3/uL (ref 3.4–10.8)

## 2021-01-12 ENCOUNTER — Other Ambulatory Visit: Payer: Self-pay | Admitting: Neurology

## 2021-01-12 MED ORDER — AMPHETAMINE-DEXTROAMPHETAMINE 20 MG PO TABS: 20.0000 mg | ORAL_TABLET | Freq: Two times a day (BID) | ORAL | 0 refills | Status: DC

## 2021-01-24 ENCOUNTER — Encounter: Payer: 59 | Admitting: Neurology

## 2021-01-24 ENCOUNTER — Ambulatory Visit (INDEPENDENT_AMBULATORY_CARE_PROVIDER_SITE_OTHER): Payer: 59 | Admitting: Neurology

## 2021-01-24 DIAGNOSIS — M5412 Radiculopathy, cervical region: Secondary | ICD-10-CM

## 2021-01-24 DIAGNOSIS — R200 Anesthesia of skin: Secondary | ICD-10-CM | POA: Diagnosis not present

## 2021-01-24 DIAGNOSIS — Z0289 Encounter for other administrative examinations: Secondary | ICD-10-CM

## 2021-01-24 DIAGNOSIS — G5603 Carpal tunnel syndrome, bilateral upper limbs: Secondary | ICD-10-CM

## 2021-01-24 NOTE — Progress Notes (Signed)
Full Name: Chelsea Gentry Gender: Female MRN #: [id number] Date of Birth: Oct 31, 1980    Visit Date: 01/24/2021 14:42 Age: 41 Years Examining Physician: Arlice Colt, MD  Referring Physician: Arlice Colt, MD Height: 5 feet 2 inch   History: Chelsea Gentry is a 41 year old woman with multiple sclerosis who has bilateral hand and wrist pain.  On exam, she had mildly reduced sensation in the distribution of the median nerves bilaterally in both hands.  She had 4++/5 strength in the APB muscles bilaterally.  There were Tinel signs at both wrists but no Phalen sign.  Nerve conduction studies: Bilateral median motor responses had prolonged distal latencies with preserved amplitude and normal forearm conduction velocity.  The right ulnar motor response was normal.  Bilateral median sensory responses were absent and the right ulnar sensory response had a normal peak latency and amplitude.    Electromyography:  Needle EMG of selected muscles of the right arm was performed.  There was mild chronic denervation in the right pronator teres and some polyphasic motor units but normal recruitment in the biceps and deltoid.  Other muscles tested were normal.  Needle EMG of the left APB muscle was normal.  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  Scotland Korver A. Felecia Shelling, MD, PhD, FAAN Certified in Neurology, Clinical Neurophysiology, Sleep Medicine, Pain Medicine and Neuroimaging Director, [facility] [address] [place], [place] [address] [phone]  Verbal informed consent was obtained from the patient, patient was informed of potential risk of procedure, including bruising, bleeding, hematoma formation, infection, muscle weakness, muscle pain, numbness, among others.         [place]    Nerve / Sites Muscle Latency Ref. Amplitude Ref. Rel Amp Segments Distance Velocity Ref. Area    ms ms mV mV %  cm m/s m/s mVms  R Median - APB     Wrist APB 7.1 ?4.4 7.9 ?4.0 100 Wrist - APB 7   29.8     Upper arm APB 10.6  8.0  100 Upper arm - Wrist 20 56 ?49 29.8  L Median - APB     Wrist APB 7.7 ?4.4 7.7 ?4.0 100 Wrist - APB 7   27.8     Upper arm APB 10.7  6.0  78.1 Upper arm - Wrist 19 62 ?49 24.1  R Ulnar - ADM     Wrist ADM 2.2 ?3.3 9.1 ?6.0 100 Wrist - ADM 7   28.0     B.Elbow ADM 4.6  8.9  98.3 B.Elbow - Wrist 17 72 ?49 29.2     A.Elbow ADM 5.9  9.1  103 A.Elbow - B.Elbow 10 73 ?49 28.9           SNC    Nerve / Sites Rec. Site Peak Lat Ref.  Amp Ref. Segments Distance    ms ms V V  cm  R Median - Orthodromic (Dig II, Mid palm)     Dig II Wrist NR ?3.4 NR ?10 Dig II - Wrist 13  L Median - Orthodromic (Dig II, Mid palm)     Dig II Wrist NR ?3.4 NR ?10 Dig II - Wrist 13  R Ulnar - Orthodromic, (Dig V, Mid palm)     Dig V Wrist 2.3 ?3.1 11 ?5 Dig V - Wrist 11  F  Wave    Nerve F Lat Ref.   ms ms  R Ulnar - ADM 23.9 ?32.0       EMG Summary Table    Spontaneous MUAP Recruitment  Muscle IA Fib PSW Fasc Other Amp Dur. Poly Pattern  R. Deltoid Normal None None None _______ Normal Normal Normal Normal  R. Triceps brachii Normal None None None _______ Normal Normal Normal Normal  R. Biceps brachii Normal None None None _______ Normal Normal 1+ Normal  R. Extensor digitorum communis Normal None None None _______ Normal Normal 1+ Reduced  R. Abductor pollicis brevis Normal None None None _______ Normal Normal Normal Normal  R. First dorsal interosseous Normal None None None _______ Normal Normal Normal Normal  R. Pronator teres Normal None None None _______ Normal Normal 1+ Normal

## 2021-01-25 ENCOUNTER — Telehealth: Payer: Self-pay | Admitting: Neurology

## 2021-01-25 NOTE — Telephone Encounter (Signed)
Referral sent to [facility]. Phone [phone].

## 2021-02-27 NOTE — Telephone Encounter (Signed)
Patient is scheduled to see Dr. Greta Doom on 03/30/21 at 11:20 AM

## 2021-03-13 ENCOUNTER — Encounter: Payer: Self-pay | Admitting: Neurology

## 2021-03-26 ENCOUNTER — Other Ambulatory Visit: Payer: Self-pay | Admitting: Neurology

## 2021-03-28 MED ORDER — AMPHETAMINE-DEXTROAMPHETAMINE 20 MG PO TABS: 20.0000 mg | ORAL_TABLET | Freq: Two times a day (BID) | ORAL | 0 refills | Status: DC

## 2021-03-28 NOTE — Telephone Encounter (Signed)
Last OV was on 11/23/20.  ?Next OV is scheduled for 05/28/21 .  ?Last RX was written on 01/17/21 for 60 tabs.  ? ?[place] Drug Database has been reviewed.  ?

## 2021-04-25 ENCOUNTER — Other Ambulatory Visit: Payer: Self-pay | Admitting: Neurology

## 2021-04-25 DIAGNOSIS — G35 Multiple sclerosis: Secondary | ICD-10-CM

## 2021-05-07 ENCOUNTER — Other Ambulatory Visit: Payer: Self-pay | Admitting: Neurology

## 2021-05-07 MED ORDER — AMPHETAMINE-DEXTROAMPHETAMINE 20 MG PO TABS: 20.0000 mg | ORAL_TABLET | Freq: Two times a day (BID) | ORAL | 0 refills | Status: DC

## 2021-05-07 NOTE — Telephone Encounter (Signed)
Last OV was on 11/23/20.  ?Next OV is scheduled for 05/28/21.  ?Last RX was written on 03/28/21 for 60 tabs.  ? ?[place] [facility] Database has been reviewed.  ?

## 2021-05-28 ENCOUNTER — Encounter: Payer: Self-pay | Admitting: Family Medicine

## 2021-05-28 ENCOUNTER — Ambulatory Visit (INDEPENDENT_AMBULATORY_CARE_PROVIDER_SITE_OTHER): Payer: 59 | Admitting: Family Medicine

## 2021-05-28 ENCOUNTER — Telehealth: Payer: Self-pay | Admitting: Family Medicine

## 2021-05-28 VITALS — BP 143/96 | HR 86 | Ht 62.0 in | Wt 184.0 lb

## 2021-05-28 DIAGNOSIS — Z79899 Other long term (current) drug therapy: Secondary | ICD-10-CM | POA: Diagnosis not present

## 2021-05-28 DIAGNOSIS — G35 Multiple sclerosis: Secondary | ICD-10-CM

## 2021-05-28 DIAGNOSIS — R7989 Other specified abnormal findings of blood chemistry: Secondary | ICD-10-CM

## 2021-05-28 DIAGNOSIS — F419 Anxiety disorder, unspecified: Secondary | ICD-10-CM

## 2021-05-28 DIAGNOSIS — F32A Depression, unspecified: Secondary | ICD-10-CM

## 2021-05-28 NOTE — Patient Instructions (Signed)

## 2021-05-28 NOTE — Telephone Encounter (Signed)
Referral for Psychiatry sent to [facility] [phone].

## 2021-05-28 NOTE — Progress Notes (Signed)
Chief Complaint  Patient presents with   Follow-up    RM 1, alone. Last seen 11/23/20.  Having increased bladder issues. Wanting to discuss medication options. Had seen urologist in past for kidney stones (around 2022).  She would like to talk about cymbalta.     HISTORY OF PRESENT ILLNESS:  05/28/21 ALL:  Chelsea Gentry is a 41 y.o. female here today for follow up for RRMS on Vumerity. MRI cervical spine and brain stable 03/2020. Labs have been normal. Last MRI brain and cervical spine stable 03/2020  She feels that she is doing fairly well. No new or worsening symptoms. Gait is stable.  Right hand numbness. NCS/EMG showed bilateral median neuropathies across wrists and minimal right C6 radiculopathy. She is schedule for right CTR this week and plans to do left wrist in the fall.   Migraines are easily aborted with rizatriptan.   Adderall 20mg  BID helps some with fatigue. She does not always take second dose.   She reports feeling more anxious. She feels uncomfortable in crowds. She reports increased heart rate at times. She feels sad and withdrawn at times. She started Trintellix about a year ago but insurance will not cover. She has tried and failed sertraline and duloxetine since with no benefit. She is hopeful to start exercising more since the weather is warmer.    She is having urge incontinence. She has not taken any rx medications. She has been hesitant.   She continues vitamin D supplements, 7000iu daily.    HISTORY (copied from Dr previous note)   Chelsea Gentry is a 41 y.o. woman with relapsing remitting multiple sclerosis diagnosed in 2016    Update 11/23/2020: She has been on Vumerity since 2021, switching from Tecfidera/DM due to copay issues.   She tolerates it well.   She has no exacerbations.    Existing symptoms are stable,   Gait and balance are doing well.  She has no trouble with stairs.  She has numbness in both hands, worse in the mornings,  regardless of wearing a splint.    She gets pain when she writes.   She wears wrist splints at night.   She wakes up with pain and numbness some nights.  She sometimes has to reposition to get comfortable.   This started 2 years ago but is worse the past few months.  Wrist splints have not helped.   Vision is stable.   Bladder has occasioanal stress incontinence.   She has never taken anything for it and would prefer to hold off for now.      She has some fatigue.  She has some depression and anxiety.   Zoloft had not helped  Cymbalta is not helping and she will be seeing Behavioral Health.   She has some apathy.   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    Adderall 20 mg IR bid has helped.   She has migraine headaches, generally better this year.   Rizatriptan helps.    She gets nausea at times.      MS History: She was diagnosed with MS in October 2016. In retrospect, about 6 or 7 years ago she had numbness in her hands more than her feet that was moderate for several weeks before it improved. At that time, she was found to have a low vitamin D and that was blamed for the incident.  In late September 2016, she presented with left visual blurring.  She had an MRI of the brain consistent with MS.   She received po steroids and did better    Her headaches associated with the ON also improved.    She was first on Copaxone but had swithced   She had 5 seizures over 3-4 years around ages [age] but none the past 14-15 years.   Poor sleep may have triggered some of the seizures.   One occurred after taking a diet energy pill.      IMAGING  MRIs of the brain from 10/11/2014, 10/13/2014 and 05/20/2016. The MRIs from October 2016 showed enhancement of the left optic nerve consistent with optic neuritis. Additionally, there were multiple T2/FLAIR hyperintense foci in the periventricular, juxtacortical and deep white matter and also one small one in the right cerebellar hemisphere. These were consistent  with a diagnosis of multiple sclerosis. The MRI from 05/20/2016 shows the chronic lesions and additionally shows at least 6 new lesions in the periventricular, deep and juxtacortical white matter with 2 enhancing lesions.     MRI of the cervical spine 08/22/2017 showed a focus to the right at [facility].and possible focus at T1.     MRI of the brain 08/24/2017 showed  Multiple T2/FLAIR hyperintense foci in the hemispheres and the pattern and configuration consistent with chronic demyelinating plaque associated with multiple sclerosis.  None of the foci appears to be acute.  When compared to the MRI dated 10/11/2014, there are a couple more foci on the current MRI.   MRI of the brain 12/18/2018 showed multiple T2/flair hyperintense foci in the hemispheres and 1 in the upper spinal cord in a pattern and configuration consistent with chronic demyelinating plaque associated with multiple sclerosis.  None of the foci appears to be acute.  Compared to the MRI dated 08/22/2017, there are no new lesions.   MR of the brain 03/17/2020 showed Multiple T2 hyperintense foci in the hemispheres and upper spinal cord in a pattern and configuration consistent with chronic demyelinating plaque associated with multiple sclerosis.  None of the foci appear to be acute.  Compared to the MRI dated 12/18/2018, there are no new lesions.   MRI of the cervical spine 03/17/2020 showed  T2 hyperintense focus towards the right adjacent to C2-C3 unchanged compared to the previous MRI from 08/22/2017 and consistent with a chronic demyelinating plaque associated with multiple sclerosis.      Disc protrusion at C5-C6 has progressed compared to the previous MRI but does not lead to spinal stenosis or nerve root compression  REVIEW OF SYSTEMS: Out of a complete 14 system review of symptoms, the patient complains only of the following symptoms, headaches, depression, anxiety,  and all other reviewed systems are negative.    ALLERGIES: Allergies   Allergen Reactions   Shellfish Allergy Anaphylaxis     HOME MEDICATIONS: Outpatient Medications Prior to Visit  Medication Sig Dispense Refill   amphetamine-dextroamphetamine (ADDERALL) 20 MG tablet Take 1 tablet (20 mg total) by mouth 2 (two) times daily. 60 tablet 0   Cholecalciferol (VITAMIN D PO) Take 7,000 Units by mouth daily.     DULoxetine (CYMBALTA) 60 MG capsule Take 1 capsule (60 mg total) by mouth daily. 60 capsule 11   montelukast (SINGULAIR) 10 MG tablet Take 10 mg by mouth at bedtime.     ondansetron (ZOFRAN ODT) 4 MG disintegrating tablet Take 1 tablet twice daily as needed 10 tablet 2   rizatriptan (MAXALT) 10 MG tablet Take 1 tablet (10 mg total) by mouth as  needed for migraine. May repeat in 2 hours if needed 10 tablet 11   VUMERITY 231 MG CPDR TAKE 2 CAPSULES ( ) BY  MOUTH TWICE DAILY IN THE  MORNING AND AT BEDTIME 120 capsule 11   No facility-administered medications prior to visit.     PAST MEDICAL HISTORY: Past Medical History:  Diagnosis Date   Anxiety    Dysmenorrhea    Multiple sclerosis (HCC)    Seizures (HCC)    Vision abnormalities      PAST SURGICAL HISTORY: Past Surgical History:  Procedure Laterality Date   DILATION AND EVACUATION N/A 12/18/2013   Procedure: DILATATION AND EVACUATION Ultrasound guided;  Surgeon: Dorien Chihuahua. Richardson Dopp, MD;  Location: [facility];  Service: Gynecology;  Laterality: N/A;   TONSILLECTOMY       FAMILY HISTORY: Family History  Problem Relation Age of Onset   Liver cancer Mother    Heart disease Father    AAA (abdominal aortic aneurysm) Father    Hypertension Father    High Cholesterol Father    Atrial fibrillation Father    Healthy Brother      SOCIAL HISTORY: Social History   Socioeconomic History   Marital status: Married    Spouse name: Not on file   Number of children: Not on file   Years of education: Not on file   Highest education level: Not on file  Occupational History   Not on file  Tobacco  Use   Smoking status: Every Day   Smokeless tobacco: Never  Substance and Sexual Activity   Alcohol use: Yes    Comment: seldom   Drug use: No   Sexual activity: Yes  Other Topics Concern   Not on file  Social History Narrative   Not on file   Social Determinants of Health   Financial Resource Strain: Not on file  Food Insecurity: Not on file  Transportation Needs: Not on file  Physical Activity: Not on file  Stress: Not on file  Social Connections: Not on file  Intimate Partner Violence: Not on file      PHYSICAL EXAM  Vitals:   05/28/21 1502  BP: (!) 143/96  Pulse: 86  Weight: 184 lb (83.5 kg)  Height:  (1.575 m)    Body mass index is 33.65 kg/m.   Generalized: Well developed, in no acute distress  Cardiology: normal rate and rhythm, no murmur auscultated  Respiratory: clear to auscultation bilaterally    Neurological examination  Mentation: Alert oriented to time, place, history taking. Follows all commands speech and language fluent Cranial nerve II-XII: Pupils were equal round reactive to light. Extraocular movements were full, visual field were full on confrontational test. Facial sensation and strength were normal. Head turning and shoulder shrug  were normal and symmetric. Motor: The motor testing reveals 5 over 5 strength of all 4 extremities. Good symmetric motor tone is noted throughout.  Sensory: Sensory testing is intact to soft touch on all 4 extremities. No evidence of extinction is noted.  Coordination: Cerebellar testing reveals good finger-nose-finger and heel-to-shin bilaterally.  Gait and station: Gait is normal.  Reflexes: Deep tendon reflexes are symmetric and normal bilaterally.     DIAGNOSTIC DATA (LABS, IMAGING, TESTING) - I reviewed patient records, labs, notes, testing and imaging myself where available.  Lab Results  Component Value Date   WBC 7.5 11/23/2020   HGB 13.9 11/23/2020   HCT 39.9 11/23/2020   MCV 88 11/23/2020    PLT 263 11/23/2020  Component Value Date/Time   GLUCOSE 88 06/08/2011 0928   PROT 6.6 05/24/2020 0833   ALBUMIN 4.6 05/24/2020 0833   AST 9 05/24/2020 0833   ALT 11 05/24/2020 0833   ALKPHOS 51 05/24/2020 0833   BILITOT 0.5 05/24/2020 0833   No results found for: CHOL, HDL, LDLCALC, LDLDIRECT, TRIG, CHOLHDL No results found for: YHCW2B No results found for: VITAMINB12 Lab Results  Component Value Date   TSH 2.720 06/19/2016        View : No data to display.               View : No data to display.           ASSESSMENT AND PLAN  41 y.o. year old female  has a past medical history of Anxiety, Dysmenorrhea, Multiple sclerosis (HCC), Seizures (HCC), and Vision abnormalities. here with     Multiple sclerosis (HCC) - Plan: MR BRAIN W WO CONTRAST, MR CERVICAL SPINE W WO CONTRAST, CBC with Differential/Platelets, CMP  High risk medication use - Plan: CBC with Differential/Platelets, CMP  Low vitamin D level - Plan: Vitamin D, 25-hydroxy  Anxiety and depression - Plan: Ambulatory referral to Psychiatry  Kampbell is doing well, today. We will continue Vumerity. I will update labs. She will continue duloxetine, Adderall and rizatriptan as prescribed. PDMP appropriate. We have discussed medicaitons/therapies that can be helpful in stress and urge incontinence. She is hesitant to start any new medications. She request a referral to psychiatry for concerns of anxiety and depression. She has tried and failed sertraline, duloxetine and short course of Trintellix. Referral placed. Healthy lifestyle habits encouraged. Regular exercise will be helpful. She will follow up with Dr Epimenio Foot in 6 months, sooner if needed.   Orders Placed This Encounter  Procedures   MR BRAIN W WO CONTRAST    Standing Status:   Future    Standing Expiration Date:   05/29/2022    Order Specific Question:   If indicated for the ordered procedure, I authorize the administration of contrast media per  Radiology protocol    Answer:   Yes    Order Specific Question:   What is the patient's sedation requirement?    Answer:   No Sedation    Order Specific Question:   Does the patient have a pacemaker or implanted devices?    Answer:   No    Order Specific Question:   Radiology Contrast Protocol - do NOT remove file path    Answer:   [email or web address]    Order Specific Question:   Preferred imaging location?    Answer:   Internal   MR CERVICAL SPINE W WO CONTRAST    Standing Status:   Future    Standing Expiration Date:   05/29/2022    Order Specific Question:   If indicated for the ordered procedure, I authorize the administration of contrast media per Radiology protocol    Answer:   Yes    Order Specific Question:   What is the patient's sedation requirement?    Answer:   No Sedation    Order Specific Question:   Does the patient have a pacemaker or implanted devices?    Answer:   No    Order Specific Question:   Preferred imaging location?    Answer:   Internal   CBC with Differential/Platelets   Vitamin D, 25-hydroxy   CMP   Ambulatory referral to Psychiatry    Referral Priority:   Routine  Referral Type:   Psychiatric    Referral Reason:   Specialty Services Required    Requested Specialty:   Psychiatry    Number of Visits Requested:   1     No orders of the defined types were placed in this encounter.    Shawnie Dapper, MSN, FNP-C 05/28/2021, 3:37 PM  [facility] [address], [address] [place], [place] [address] [phone]

## 2021-05-29 LAB — CBC WITH DIFFERENTIAL/PLATELET
Basophils Absolute: 0.1 10*3/uL (ref 0.0–0.2)
Basos: 1 %
EOS (ABSOLUTE): 0.1 10*3/uL (ref 0.0–0.4)
Eos: 1 %
Hematocrit: 40.2 % (ref 34.0–46.6)
Hemoglobin: 13.9 g/dL (ref 11.1–15.9)
Immature Grans (Abs): 0 10*3/uL (ref 0.0–0.1)
Immature Granulocytes: 0 %
Lymphocytes Absolute: 2.2 10*3/uL (ref 0.7–3.1)
Lymphs: 31 %
MCH: 30 pg (ref 26.6–33.0)
MCHC: 34.6 g/dL (ref 31.5–35.7)
MCV: 87 fL (ref 79–97)
Monocytes Absolute: 0.7 10*3/uL (ref 0.1–0.9)
Monocytes: 9 %
Neutrophils Absolute: 4.1 10*3/uL (ref 1.4–7.0)
Neutrophils: 58 %
Platelets: 236 10*3/uL (ref 150–450)
RBC: 4.63 x10E6/uL (ref 3.77–5.28)
RDW: 12.2 % (ref 11.7–15.4)
WBC: 7.1 10*3/uL (ref 3.4–10.8)

## 2021-05-29 LAB — COMPREHENSIVE METABOLIC PANEL
ALT: 16 IU/L (ref 0–32)
AST: 14 IU/L (ref 0–40)
Albumin/Globulin Ratio: 2 (ref 1.2–2.2)
Albumin: 4.5 g/dL (ref 3.8–4.8)
Alkaline Phosphatase: 56 IU/L (ref 44–121)
BUN/Creatinine Ratio: 11 (ref 9–23)
BUN: 8 mg/dL (ref 6–20)
Bilirubin Total: 0.3 mg/dL (ref 0.0–1.2)
CO2: 24 mmol/L (ref 20–29)
Calcium: 9.2 mg/dL (ref 8.7–10.2)
Chloride: 103 mmol/L (ref 96–106)
Creatinine, Ser: 0.76 mg/dL (ref 0.57–1.00)
Globulin, Total: 2.3 g/dL (ref 1.5–4.5)
Glucose: 82 mg/dL (ref 70–99)
Potassium: 4.8 mmol/L (ref 3.5–5.2)
Sodium: 142 mmol/L (ref 134–144)
Total Protein: 6.8 g/dL (ref 6.0–8.5)
eGFR: 103 mL/min/{1.73_m2} (ref >59)

## 2021-05-29 LAB — VITAMIN D 25 HYDROXY (VIT D DEFICIENCY, FRACTURES): Vit D, 25-Hydroxy: 55.1 ng/mL (ref 30.0–100.0)

## 2021-05-30 ENCOUNTER — Telehealth: Payer: Self-pay | Admitting: Family Medicine

## 2021-05-30 MED ORDER — ALPRAZOLAM 0.5 MG PO TABS: 0.5000 mg | ORAL_TABLET | Freq: Once | ORAL | 0 refills | Status: DC | PRN

## 2021-05-30 NOTE — Addendum Note (Signed)
Addended by: Asa Lente on: 05/30/2021 05:14 PM   Modules accepted: Orders

## 2021-05-30 NOTE — Addendum Note (Signed)
Addended by: Judi Cong on: 05/30/2021 04:26 PM   Modules accepted: Orders

## 2021-05-30 NOTE — Telephone Encounter (Signed)
MRI brain was approved. MRI cervical spine is pending faxed notes to [facility]. Patient asked to have an open MRI done. When approved and I have signed orders back I will fax them to [facility].  Patient would like meds for MRIs.

## 2021-06-07 NOTE — Telephone Encounter (Signed)
appt 6/12 2pm

## 2021-06-07 NOTE — Telephone Encounter (Signed)
Both MRIs approved and sent to [facility]. They will reach out to the patient to schedule.

## 2021-06-21 ENCOUNTER — Encounter: Payer: Self-pay | Admitting: Family Medicine

## 2021-06-21 ENCOUNTER — Other Ambulatory Visit: Payer: Self-pay | Admitting: Neurology

## 2021-06-21 MED ORDER — RIZATRIPTAN BENZOATE 10 MG PO TABS: 10.0000 mg | ORAL_TABLET | ORAL | 11 refills | Status: DC | PRN

## 2021-06-25 ENCOUNTER — Telehealth: Payer: Self-pay | Admitting: Neurology

## 2021-06-25 NOTE — Telephone Encounter (Signed)
MRI brain and cervical spine  *MRI brain: Focal T2 hyperintensities in the white matter consistent with the patient's diagnosis of multiple sclerosis. These are unchanged when compared to 03/17/2020. No new lesions identified.   MRI cervical spine 1.  Demyelinating plaque in the right side of the spinal cord at C2 unchanged from the prior study. No new lesions.  2.  Disc osteophyte complex on the right at C5-6 extending into the right neural foramen resulting in moderate foraminal stenosis.   Will send to Amy to review and result for the patient

## 2021-06-26 ENCOUNTER — Encounter: Payer: Self-pay | Admitting: Neurology

## 2021-06-26 NOTE — Telephone Encounter (Signed)
Verbally discussed the results with Amy and she reviewed the results. She states that MRI of both brain and c spine do not show concerns of any new lesions when compared to previous imaging. The cervical MRI shows C 5-6 level shows a disc protrusion that has been present. There is a narrowing on the spine but doesn't seem to be pressing on a nerve.  I have sent a mychart message advising the pt of this information.

## 2021-07-16 ENCOUNTER — Other Ambulatory Visit: Payer: Self-pay | Admitting: Neurology

## 2021-07-17 MED ORDER — AMPHETAMINE-DEXTROAMPHETAMINE 20 MG PO TABS: 20.0000 mg | ORAL_TABLET | Freq: Two times a day (BID) | ORAL | 0 refills | Status: DC

## 2021-07-17 NOTE — Telephone Encounter (Signed)
Last OV was on 05/28/21.  Next OV is scheduled for 12/05/21.  Last RX was written on 05/07/21 for 60 tabs.   [place] Drug Database has been reviewed.

## 2021-09-11 ENCOUNTER — Telehealth: Payer: Self-pay | Admitting: *Deleted

## 2021-09-11 NOTE — Telephone Encounter (Signed)
Sumitted PA Vumerity on CMM. Key: [id number]. Waiting on determination from [employer].

## 2021-09-12 NOTE — Telephone Encounter (Signed)
PA denied. Faxed appeal letter to [facility] appeals asking for urgent review at [phone]. Received fax confirmation, waiting on determination.

## 2021-09-12 NOTE — Telephone Encounter (Signed)
Took call from phone staff and spoke w/ specialty pharmacy. They wanted to know if we are appealing Vumerity denial. Advised it was submitted today and waiting on response from insurance.

## 2021-09-18 NOTE — Telephone Encounter (Signed)
Called [employer] at [phone] and spoke w/ Pattricia Boss to check on status of appeal. Confirmed they received appeal 09/12/21. It is still under review. Case ID: [id number]. Turn around time: 10/12/2021.  Advised this needs to be urgently reviewed, cannot wait that long for a decision. She is making note for them to expedite request.  We should call back to check status in 48-72hr at [phone].  Pt ID: [id number]

## 2021-09-20 NOTE — Telephone Encounter (Signed)
Called [phone]. This was incorrect phone#. Called [phone] and spoke w/ Rocky Link who states I must call [phone]. Spoke w/ rep. Received appeal 09/12/21 but states again appeal pending and have until 10/12/21 even though appeal marked urgent. I explained urgency and she states nothing they can do but tell us update in pending appeal. Advised I would have to call [employer] at [phone] to discuss turn around time. I called and spoke w/ Rachael. States they only handles PA's, to call insurance plan directly.  Called pt at [phone]. She had an extra bottle (1 month supply) left of Vumerity. Aware insurance has until 10/12/21 to make determination and I will f/u 10/15/21 if we have not heard anything by then. She will contact [employer] for emergency supply if needed, she has done this in the past and familiar with this process.

## 2021-10-11 NOTE — Telephone Encounter (Signed)
Called [employer] at [phone] and spoke w/ Greggory Stallion. Appeal approved 10/01/21-10/02/22. Approval #[id number]. Ref# for call: [id number]. She will fax approval letter to [place] at [phone].

## 2021-12-05 ENCOUNTER — Ambulatory Visit (INDEPENDENT_AMBULATORY_CARE_PROVIDER_SITE_OTHER): Payer: 59 | Admitting: Neurology

## 2021-12-05 ENCOUNTER — Encounter: Payer: Self-pay | Admitting: Neurology

## 2021-12-05 VITALS — BP 142/91 | HR 104 | Wt 192.8 lb

## 2021-12-05 DIAGNOSIS — F32A Depression, unspecified: Secondary | ICD-10-CM

## 2021-12-05 DIAGNOSIS — G35 Multiple sclerosis: Secondary | ICD-10-CM | POA: Diagnosis not present

## 2021-12-05 DIAGNOSIS — H469 Unspecified optic neuritis: Secondary | ICD-10-CM

## 2021-12-05 DIAGNOSIS — Z79899 Other long term (current) drug therapy: Secondary | ICD-10-CM

## 2021-12-05 DIAGNOSIS — G5603 Carpal tunnel syndrome, bilateral upper limbs: Secondary | ICD-10-CM | POA: Diagnosis not present

## 2021-12-05 DIAGNOSIS — G43009 Migraine without aura, not intractable, without status migrainosus: Secondary | ICD-10-CM | POA: Diagnosis not present

## 2021-12-05 DIAGNOSIS — F419 Anxiety disorder, unspecified: Secondary | ICD-10-CM

## 2021-12-05 NOTE — Progress Notes (Signed)
[facility]  PATIENT: Chelsea Gentry DOB: September 11, 1980  REFERRING DOCTOR OR PCP:  Referred by Dr. Hyacinth Meeker ([facility]);   Henreitta Leber, PA-C [facility]) SOURCE: patient, notes from Dr. Hyacinth Meeker, labs and imaging reports since 2016,   _________________________________   HISTORICAL  CHIEF COMPLAINT:  Chief Complaint  Patient presents with   Follow-up    RM 2, alone. MS f/u. DMT: Vumerity, tolerating well. No new sx.     HISTORY OF PRESENT ILLNESS:   Chelsea Gentry is a 41 y.o. woman with relapsing remitting multiple sclerosis diagnosed in 2016   Update 12/05/2021: She has been on Vumerity since 2021, switching from Tecfidera/DM due to copay issues.   She tolerates it well.  No GI issues.  She has rare flushing, much less than initially.    She has no exacerbations.    Existing symptoms are stable,  Gait and balance are doing well.  She has no trouble with stairs and does not need the bannister.   She miht be able to walk one hor straight.   Hand numbness resolved since her CTR for bilateral CTS (Dr. Yehuda Budd).    Vision is stable.   Bladder has occasioanal stress incontinence.   She has never taken anything for it and would prefer to hold off for now.     She has some fatigue.  She has some depression and anxiety.   She is on Cymbalta, Buspar. 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    Adderall 20 mg IR bid helped but he felt mean.  However, Ritalin and Strattera did not help as much and she may go back.    She has migraine headaches, but these are less common, averages 1/month.   Rizatriptan helps.    She gets nausea at times.     MS History: She was diagnosed with MS in October 2016. In retrospect, about 6 or 7 years ago she had numbness in her hands more than her feet that was moderate for several weeks before it improved. At that time, she was found to have a low vitamin D and that was blamed for the incident.  In late September 2016, she presented with  left visual blurring.    She had an MRI of the brain consistent with MS.   She received po steroids and did better    Her headaches associated with the ON also improved.    She was first on Copaxone but had swithced  She had 5 seizures over 3-4 years around ages [age] but none the past 14-15 years.   Poor sleep may have triggered some of the seizures.   One occurred after taking a diet energy pill.      IMAGING  MRIs of the brain from 10/11/2014, 10/13/2014 and 05/20/2016. The MRIs from October 2016 showed enhancement of the left optic nerve consistent with optic neuritis. Additionally, there were multiple T2/FLAIR hyperintense foci in the periventricular, juxtacortical and deep white matter and also one small one in the right cerebellar hemisphere. These were consistent with a diagnosis of multiple sclerosis. The MRI from 05/20/2016 shows the chronic lesions and additionally shows at least 6 new lesions in the periventricular, deep and juxtacortical white matter with 2 enhancing lesions.    MRI of the cervical spine 08/22/2017 showed a focus to the right at [facility].and possible focus at T1.    MRI of the brain 08/24/2017 showed  Multiple T2/FLAIR hyperintense foci in the hemispheres and the pattern and  configuration consistent with chronic demyelinating plaque associated with multiple sclerosis.  None of the foci appears to be acute.  When compared to the MRI dated 10/11/2014, there are a couple more foci on the current MRI.  MRI of the brain 12/18/2018 showed multiple T2/flair hyperintense foci in the hemispheres and 1 in the upper spinal cord in a pattern and configuration consistent with chronic demyelinating plaque associated with multiple sclerosis.  None of the foci appears to be acute.  Compared to the MRI dated 08/22/2017, there are no new lesions.  MR of the brain 03/17/2020 showed Multiple T2 hyperintense foci in the hemispheres and upper spinal cord in a pattern and configuration consistent with  chronic demyelinating plaque associated with multiple sclerosis.  None of the foci appear to be acute.  Compared to the MRI dated 12/18/2018, there are no new lesions.  MRI of the cervical spine 03/17/2020 showed  T2 hyperintense focus towards the right adjacent to C2-C3 unchanged compared to the previous MRI from 08/22/2017 and consistent with a chronic demyelinating plaque associated with multiple sclerosis.      Disc protrusion at C5-C6 has progressed compared to the previous MRI but does not lead to spinal stenosis or nerve root compression   REVIEW OF SYSTEMS: Constitutional: No fevers, chills, sweats, or change in appetite.   Some fatigue Eyes: No visual changes, double vision, eye pain Ear, nose and throat: No hearing loss, ear pain, nasal congestion, sore throat Cardiovascular: No chest pain, palpitations Respiratory:  No shortness of breath at rest or with exertion.   No wheezes GastrointestinaI: No nausea, vomiting, diarrhea, abdominal pain, fecal incontinence Genitourinary:  No dysuria, urinary retention or frequency.  No nocturia. Musculoskeletal:  No neck pain, back pain Integumentary: No rash, pruritus, skin lesions Neurological: as above Psychiatric: No depression at this time.  No anxiety Endocrine: No palpitations, diaphoresis, change in appetite, change in weigh or increased thirst Hematologic/Lymphatic:  No anemia, purpura, petechiae. Allergic/Immunologic: No itchy/runny eyes, nasal congestion, recent allergic reactions, rashes  ALLERGIES: Allergies  Allergen Reactions   Shellfish Allergy Anaphylaxis    HOME MEDICATIONS:  Current Outpatient Medications:    busPIRone (BUSPAR) 10 MG tablet, Take 10 mg by mouth 2 (two) times daily., Disp: , Rfl:    Cholecalciferol (VITAMIN D PO), Take 7,000 Units by mouth daily., Disp: , Rfl:    DULoxetine (CYMBALTA) 30 MG capsule, Take 90 mg by mouth daily., Disp: , Rfl:    montelukast (SINGULAIR) 10 MG tablet, Take 10 mg by mouth at  bedtime., Disp: , Rfl:    rizatriptan (MAXALT) 10 MG tablet, Take 1 tablet (10 mg total) by mouth as needed for migraine. May repeat in 2 hours if needed, Disp: 10 tablet, Rfl: 11   VUMERITY 231 MG CPDR, TAKE 2 CAPSULES (462MG ) BY  MOUTH TWICE DAILY IN THE  MORNING AND AT BEDTIME, Disp: 120 capsule, Rfl: 11  PAST MEDICAL HISTORY: Past Medical History:  Diagnosis Date   Anxiety    Dysmenorrhea    Multiple sclerosis (HCC)    Seizures (HCC)    Vision abnormalities     PAST SURGICAL HISTORY: Past Surgical History:  Procedure Laterality Date   DILATION AND EVACUATION N/A 12/18/2013   Procedure: DILATATION AND EVACUATION Ultrasound guided;  Surgeon: 14/12/2013. Dorien Chihuahua, MD;  Location: [facility];  Service: Gynecology;  Laterality: N/A;   TONSILLECTOMY      FAMILY HISTORY: Family History  Problem Relation Age of Onset   Liver cancer Mother    Heart  disease Father    AAA (abdominal aortic aneurysm) Father    Hypertension Father    High Cholesterol Father    Atrial fibrillation Father    Healthy Brother     SOCIAL HISTORY:  Social History   Socioeconomic History   Marital status: Married    Spouse name: Not on file   Number of children: Not on file   Years of education: Not on file   Highest education level: Not on file  Occupational History   Not on file  Tobacco Use   Smoking status: Every Day   Smokeless tobacco: Never  Substance and Sexual Activity   Alcohol use: Yes    Comment: seldom   Drug use: No   Sexual activity: Yes  Other Topics Concern   Not on file  Social History Narrative   Not on file   Social Determinants of Health   Financial Resource Strain: Not on file  Food Insecurity: Not on file  Transportation Needs: Not on file  Physical Activity: Not on file  Stress: Not on file  Social Connections: Not on file  Intimate Partner Violence: Not on file     PHYSICAL EXAM  Vitals:   12/05/21 1531  BP: (!) 142/91  Pulse: (!) 104  Weight: 192 lb 12.8 oz  (87.5 kg)    Body mass index is 35.26 kg/m.   General: The patient is well-developed and well-nourished and in no acute distress   Neurologic Exam  Mental status: The patient is alert and oriented x 3 at the time of the examination. The patient has apparent normal recent and remote memory, with an apparently normal attention span and concentration ability.   Speech is normal.  Cranial nerves: Extraocular movements are full.  Facial strength and sensation was normal.  Trapezius strength was normal.    No obvious hearing deficits are noted.  Motor:  Muscle bulk is normal.   Tone is normal. Strength is  5 / 5 in all 4 extremities.   Sensory: Intact sensation to touch and vibration in hands and legs.  No Tinels signs  Coordination: She performs finger-nose-finger and heel-to-shin well.  Gait and station: Station is normal.  Gait is normal.  Tandem gait is mildly wide.  Romberg is negative.   Reflexes: Deep tendon reflexes are symmetric.  Deep tendon reflexes are increased at the knees with spread.  DTRs increased at the ankles but no clonus.    ASSESSMENT AND PLAN  Multiple sclerosis (HCC) - Plan: MR BRAIN W WO CONTRAST  High risk medication use  Bilateral carpal tunnel syndrome  Migraine without aura and without status migrainosus, not intractable  Optic neuritis, left  Anxiety and depression    1.   Continue vumerity.  Check lab work.  Check MRI to determine if any subclinical progression and to evaluate for hand numbnes 2.  She will follow up with psychiatry for mood and ADD issues  3.   Stay active and exercise as tolerated. 4.   Rizatriptan as needed headache 5.   Return in 6 months or sooner if there are new or worsening neurologic symptoms.   Joson Sapp A. Epimenio Foot, MD, PhD, FAAN Certified in Neurology, Clinical Neurophysiology, Sleep Medicine, Pain Medicine and Neuroimaging Director, [facility] at [facility]  [facility] [address], [address] [place], [place] [address] [phone]

## 2021-12-06 LAB — CBC WITH DIFFERENTIAL/PLATELET
Basophils Absolute: 0.1 10*3/uL (ref 0.0–0.2)
Basos: 0 %
EOS (ABSOLUTE): 0.1 10*3/uL (ref 0.0–0.4)
Eos: 1 %
Hematocrit: 43.7 % (ref 34.0–46.6)
Hemoglobin: 14.8 g/dL (ref 11.1–15.9)
Immature Grans (Abs): 0.1 10*3/uL (ref 0.0–0.1)
Immature Granulocytes: 1 %
Lymphocytes Absolute: 1.3 10*3/uL (ref 0.7–3.1)
Lymphs: 10 %
MCH: 29.8 pg (ref 26.6–33.0)
MCHC: 33.9 g/dL (ref 31.5–35.7)
MCV: 88 fL (ref 79–97)
Monocytes Absolute: 1 10*3/uL — ABNORMAL HIGH (ref 0.1–0.9)
Monocytes: 8 %
Neutrophils Absolute: 9.8 10*3/uL — ABNORMAL HIGH (ref 1.4–7.0)
Neutrophils: 80 %
Platelets: 258 10*3/uL (ref 150–450)
RBC: 4.97 x10E6/uL (ref 3.77–5.28)
RDW: 12.1 % (ref 11.7–15.4)
WBC: 12.2 10*3/uL — ABNORMAL HIGH (ref 3.4–10.8)

## 2021-12-06 LAB — COMPREHENSIVE METABOLIC PANEL
ALT: 20 IU/L (ref 0–32)
AST: 16 IU/L (ref 0–40)
Albumin/Globulin Ratio: 2 (ref 1.2–2.2)
Albumin: 4.3 g/dL (ref 3.9–4.9)
Alkaline Phosphatase: 66 IU/L (ref 44–121)
BUN/Creatinine Ratio: 10 (ref 9–23)
BUN: 7 mg/dL (ref 6–20)
Bilirubin Total: 0.4 mg/dL (ref 0.0–1.2)
CO2: 22 mmol/L (ref 20–29)
Calcium: 9.5 mg/dL (ref 8.7–10.2)
Chloride: 101 mmol/L (ref 96–106)
Creatinine, Ser: 0.68 mg/dL (ref 0.57–1.00)
Globulin, Total: 2.2 g/dL (ref 1.5–4.5)
Glucose: 98 mg/dL (ref 70–99)
Potassium: 4.4 mmol/L (ref 3.5–5.2)
Sodium: 139 mmol/L (ref 134–144)
Total Protein: 6.5 g/dL (ref 6.0–8.5)
eGFR: 114 mL/min/{1.73_m2} (ref >59)

## 2022-01-16 ENCOUNTER — Telehealth: Payer: Self-pay | Admitting: Neurology

## 2022-01-16 NOTE — Telephone Encounter (Signed)
[employer] Josem Kaufmann: [id number] exp. 01/16/22-03/02/22 sent to [facility] for open MRI per patient request [phone]

## 2022-01-22 ENCOUNTER — Encounter: Payer: Self-pay | Admitting: Neurology

## 2022-01-24 ENCOUNTER — Encounter: Payer: Self-pay | Admitting: Family Medicine

## 2022-01-24 ENCOUNTER — Other Ambulatory Visit: Payer: Self-pay | Admitting: Neurology

## 2022-01-24 MED ORDER — ALPRAZOLAM 0.5 MG PO TABS: 0.5000 mg | ORAL_TABLET | Freq: Once | ORAL | 0 refills | Status: DC | PRN

## 2022-01-30 DIAGNOSIS — E0590 Thyrotoxicosis, unspecified without thyrotoxic crisis or storm: Secondary | ICD-10-CM | POA: Insufficient documentation

## 2022-02-21 ENCOUNTER — Encounter: Payer: Self-pay | Admitting: Neurology

## 2022-03-27 ENCOUNTER — Other Ambulatory Visit: Payer: Self-pay

## 2022-03-27 ENCOUNTER — Encounter (HOSPITAL_BASED_OUTPATIENT_CLINIC_OR_DEPARTMENT_OTHER): Payer: Self-pay

## 2022-03-27 ENCOUNTER — Emergency Department (HOSPITAL_BASED_OUTPATIENT_CLINIC_OR_DEPARTMENT_OTHER)
Admission: EM | Admit: 2022-03-27 | Discharge: 2022-03-27 | Disposition: A | Discharge status: Home / Self Care | Payer: 59 | Attending: Emergency Medicine | Admitting: Emergency Medicine

## 2022-03-27 DIAGNOSIS — R1010 Upper abdominal pain, unspecified: Secondary | ICD-10-CM | POA: Insufficient documentation

## 2022-03-27 DIAGNOSIS — R112 Nausea with vomiting, unspecified: Secondary | ICD-10-CM | POA: Insufficient documentation

## 2022-03-27 DIAGNOSIS — E860 Dehydration: Secondary | ICD-10-CM | POA: Diagnosis not present

## 2022-03-27 DIAGNOSIS — F1210 Cannabis abuse, uncomplicated: Secondary | ICD-10-CM | POA: Insufficient documentation

## 2022-03-27 LAB — URINALYSIS, ROUTINE W REFLEX MICROSCOPIC
Bilirubin Urine: NEGATIVE
Glucose, UA: NEGATIVE mg/dL
Hgb urine dipstick: NEGATIVE
Leukocytes,Ua: NEGATIVE
Nitrite: NEGATIVE
Specific Gravity, Urine: 1.023 (ref 1.005–1.030)
pH: 6 (ref 5.0–8.0)

## 2022-03-27 LAB — COMPREHENSIVE METABOLIC PANEL
ALT: 15 U/L (ref 0–44)
AST: 16 U/L (ref 15–41)
Albumin: 4.7 g/dL (ref 3.5–5.0)
Alkaline Phosphatase: 49 U/L (ref 38–126)
Anion gap: 10 (ref 5–15)
BUN: 6 mg/dL (ref 6–20)
CO2: 22 mmol/L (ref 22–32)
Calcium: 9.7 mg/dL (ref 8.9–10.3)
Chloride: 105 mmol/L (ref 98–111)
Creatinine, Ser: 0.66 mg/dL (ref 0.44–1.00)
GFR, Estimated: >60 mL/min (ref >60)
Glucose, Bld: 90 mg/dL (ref 70–99)
Potassium: 3.7 mmol/L (ref 3.5–5.1)
Sodium: 137 mmol/L (ref 135–145)
Total Bilirubin: 0.5 mg/dL (ref 0.3–1.2)
Total Protein: 7 g/dL (ref 6.5–8.1)

## 2022-03-27 LAB — CBC
HCT: 45.3 % (ref 36.0–46.0)
Hemoglobin: 15.3 g/dL — ABNORMAL HIGH (ref 12.0–15.0)
MCH: 28.8 pg (ref 26.0–34.0)
MCHC: 33.8 g/dL (ref 30.0–36.0)
MCV: 85.3 fL (ref 80.0–100.0)
Platelets: 239 10*3/uL (ref 150–400)
RBC: 5.31 MIL/uL — ABNORMAL HIGH (ref 3.87–5.11)
RDW: 14.7 % (ref 11.5–15.5)
WBC: 9.9 10*3/uL (ref 4.0–10.5)
nRBC: 0 % (ref 0.0–0.2)

## 2022-03-27 LAB — RAPID URINE DRUG SCREEN, HOSP PERFORMED
Amphetamines: NOT DETECTED
Barbiturates: NOT DETECTED
Benzodiazepines: NOT DETECTED
Cocaine: NOT DETECTED
Opiates: NOT DETECTED
Tetrahydrocannabinol: POSITIVE — AB

## 2022-03-27 LAB — LIPASE, BLOOD: Lipase: 18 U/L (ref 11–51)

## 2022-03-27 LAB — PREGNANCY, URINE: Preg Test, Ur: NEGATIVE

## 2022-03-27 MED ORDER — LACTATED RINGERS IV BOLUS
1000.0000 mL | Freq: Once | INTRAVENOUS | Status: AC
  Administered 2022-03-27: 1000 mL via INTRAVENOUS

## 2022-03-27 MED ORDER — ONDANSETRON HCL 4 MG/2ML IJ SOLN
4.0000 mg | Freq: Once | INTRAMUSCULAR | Status: AC
  Administered 2022-03-27: 4 mg via INTRAVENOUS
  Filled 2022-03-27: qty 2

## 2022-03-27 NOTE — ED Notes (Signed)
Fluids given... No new or extra feeling of N/V.Marland KitchenMarland Kitchen

## 2022-03-27 NOTE — ED Triage Notes (Signed)
Patient here POV from Home.  Endorses nausea that began in December. Worsened over the past three weeks. Some Loose Stools. No Discernable Pain but notes some Tightness at times to lower ABD when using the BR.   Scheduled to see GI in 1-2 Weeks.   NAD Noted during Triage. A&Ox4. GCS 15. Ambulatory.

## 2022-03-27 NOTE — Discharge Instructions (Signed)
It was our pleasure to provide your ER care today - we hope that you feel better.  Drink plenty of fluids/stay well hydrated. Take acetaminophen or ibuprofen as need. Take zofran as need for nausea.  Follow up with your doctor/gi doctor in the coming week for recheck.   Note that increasingly we are seeing a recurrent abdominal discomfort and/or recurrent nausea and vomiting syndrome called Cannabinoid Hyperemesis Syndrome - see attached info - in these cases avoiding marijuana use will prevent symptoms from recurrent (note that symptoms can persist for a few weeks if history of heavy marijuana use as it can take time to get out of system).   Return to ER right away  if worse, fevers, new symptoms, new or worsening or severe abdominal pain, persistent vomiting, chest pain, trouble breathing, or other emergency concern.  You were given pain meds in the ER - no driving for the next 6 hours.

## 2022-03-27 NOTE — ED Notes (Signed)
Discharge paperwork given and verbally understood. 

## 2022-03-27 NOTE — ED Provider Notes (Signed)
[facility] Provider Note   CSN: [id number] Arrival date & time: 03/27/22  1409     History  Chief Complaint  Patient presents with   Nausea    Chelsea Gentry is a 42 y.o. female.  Patient c/o recurrent nausea for past 3-4 months, and intermittent upper abd discomfort. No specific exacerbating or alleviating symptoms. Symptoms occur daily. Not related to certain foods. No prior egd but does have upcoming gi appt. Denies hx pud, pancreatitis or gallstones. No fever or chills. No abd distension. No dysuria or gu c/o. No lower abd pain or pelvic pain. Having normal periods. No vaginal discharge or bleeding. No severe wt loss, possible a few lbs. No chest pain or sob. No cough or uri symptoms.   The history is provided by the patient and medical records.       Home Medications Prior to Admission medications   Medication Sig Start Date End Date Taking? Authorizing Provider  ALPRAZolam Duanne Moron) 0.5 MG tablet Take 1 tablet (0.5 mg total) by mouth once as needed for up to 1 dose for anxiety (MRI). 01/24/22   Lomax, Amy, NP  busPIRone (BUSPAR) 10 MG tablet Take 10 mg by mouth 2 (two) times daily. 10/27/21   [provider]  Cholecalciferol (VITAMIN D PO) Take 7,000 Units by mouth daily.    [provider]  DULoxetine (CYMBALTA) 30 MG capsule Take 90 mg by mouth daily.    [provider]  montelukast (SINGULAIR) 10 MG tablet Take 10 mg by mouth at bedtime.    [provider]  rizatriptan (MAXALT) 10 MG tablet Take 1 tablet (10 mg total) by mouth as needed for migraine. May repeat in 2 hours if needed 06/21/21   Lomax, Amy, NP  VUMERITY 231 MG CPDR TAKE 2 CAPSULES (462MG ) BY  MOUTH TWICE DAILY IN THE  MORNING AND AT BEDTIME 04/25/21   Sater, Nanine Means, MD      Allergies    Shellfish allergy    Review of Systems   Review of Systems  Constitutional:  Negative for chills and fever.  HENT:  Negative for sore  throat.   Eyes:  Negative for redness.  Respiratory:  Negative for cough and shortness of breath.   Cardiovascular:  Negative for chest pain.  Gastrointestinal:  Positive for nausea and vomiting. Negative for abdominal distention and constipation.  Genitourinary:  Negative for dysuria and flank pain.  Musculoskeletal:  Negative for back pain and neck pain.  Skin:  Negative for rash.  Neurological:  Negative for headaches.  Hematological:  Does not bruise/bleed easily.  Psychiatric/Behavioral:  Negative for confusion.     Physical Exam Updated Vital Signs BP 128/71 (BP Location: Right Arm)   Pulse 64   Temp 98.1 F (36.7 C) (Oral)   Resp 16   Ht 1.575 m (5\' 2" )   Wt 76.2 kg   SpO2 100%   BMI 30.73 kg/m  Physical Exam Vitals and nursing note reviewed.  Constitutional:      Appearance: Normal appearance. She is well-developed.  HENT:     Head: Atraumatic.     Nose: Nose normal.     Mouth/Throat:     Mouth: Mucous membranes are moist.  Eyes:     General: No scleral icterus.    Conjunctiva/sclera: Conjunctivae normal.  Neck:     Trachea: No tracheal deviation.  Cardiovascular:     Rate and Rhythm: Normal rate and regular rhythm.  Pulses: Normal pulses.     Heart sounds: Normal heart sounds. No murmur heard.    No friction rub. No gallop.  Pulmonary:     Effort: Pulmonary effort is normal. No respiratory distress.     Breath sounds: Normal breath sounds.  Abdominal:     General: Bowel sounds are normal. There is no distension.     Palpations: Abdomen is soft. There is no mass.     Tenderness: There is no abdominal tenderness. There is no guarding.  Genitourinary:    Comments: No cva tenderness.  Musculoskeletal:        General: No swelling or tenderness.     Cervical back: Normal range of motion and neck supple. No rigidity. No muscular tenderness.     Right lower leg: No edema.     Left lower leg: No edema.  Skin:    General: Skin is warm and dry.      Findings: No rash.  Neurological:     Mental Status: She is alert.     Comments: Alert, speech normal.   Psychiatric:        Mood and Affect: Mood normal.     ED Results / Procedures / Treatments   Labs (all labs ordered are listed, but only abnormal results are displayed) Results for orders placed or performed during the hospital encounter of 03/27/22  Lipase, blood  Result Value Ref Range   Lipase 18 11 - 51 U/L  Comprehensive metabolic panel  Result Value Ref Range   Sodium 137 135 - 145 mmol/L   Potassium 3.7 3.5 - 5.1 mmol/L   Chloride 105 98 - 111 mmol/L   CO2 22 22 - 32 mmol/L   Glucose, Bld 90 70 - 99 mg/dL   BUN 6 6 - 20 mg/dL   Creatinine, Ser 0.66 0.44 - 1.00 mg/dL   Calcium 9.7 8.9 - 10.3 mg/dL   Total Protein 7.0 6.5 - 8.1 g/dL   Albumin 4.7 3.5 - 5.0 g/dL   AST 16 15 - 41 U/L   ALT 15 0 - 44 U/L   Alkaline Phosphatase 49 38 - 126 U/L   Total Bilirubin 0.5 0.3 - 1.2 mg/dL   GFR, Estimated >60 >60 mL/min   Anion gap 10 5 - 15  CBC  Result Value Ref Range   WBC 9.9 4.0 - 10.5 K/uL   RBC 5.31 (H) 3.87 - 5.11 MIL/uL   Hemoglobin 15.3 (H) 12.0 - 15.0 g/dL   HCT 45.3 36.0 - 46.0 %   MCV 85.3 80.0 - 100.0 fL   MCH 28.8 26.0 - 34.0 pg   MCHC 33.8 30.0 - 36.0 g/dL   RDW 14.7 11.5 - 15.5 %   Platelets 239 150 - 400 K/uL   nRBC 0.0 0.0 - 0.2 %  Urinalysis, Routine w reflex microscopic -Urine, Clean Catch  Result Value Ref Range   Color, Urine YELLOW YELLOW   APPearance CLEAR CLEAR   Specific Gravity, Urine 1.023 1.005 - 1.030   pH 6.0 5.0 - 8.0   Glucose, UA NEGATIVE NEGATIVE mg/dL   Hgb urine dipstick NEGATIVE NEGATIVE   Bilirubin Urine NEGATIVE NEGATIVE   Ketones, ur TRACE (A) NEGATIVE mg/dL   Protein, ur TRACE (A) NEGATIVE mg/dL   Nitrite NEGATIVE NEGATIVE   Leukocytes,Ua NEGATIVE NEGATIVE  Pregnancy, urine  Result Value Ref Range   Preg Test, Ur NEGATIVE NEGATIVE  Rapid urine drug screen (hospital performed)  Result Value Ref Range   Opiates NONE  DETECTED NONE DETECTED   Cocaine NONE DETECTED NONE DETECTED   Benzodiazepines NONE DETECTED NONE DETECTED   Amphetamines NONE DETECTED NONE DETECTED   Tetrahydrocannabinol POSITIVE (A) NONE DETECTED   Barbiturates NONE DETECTED NONE DETECTED     EKG None  Radiology No results found.  Procedures Procedures    Medications Ordered in ED Medications  lactated ringers bolus 1,000 mL (1,000 mLs Intravenous New Bag/Given 03/27/22 1657)  ondansetron (ZOFRAN) injection 4 mg (4 mg Intravenous Given 03/27/22 1659)    ED Course/ Medical Decision Making/ A&P                             Medical Decision Making Amount and/or Complexity of Data Reviewed Labs: ordered.  Risk Prescription drug management.   Iv ns. Continuous pulse ox and cardiac monitoring. Labs ordered/sent.   Differential diagnosis includes gastroenteritis, gastritis, pud, viral syndrome, cannabinoid hyperemesis, etc . Dispo decision including potential need for admission considered - will get labs and reassess.   Reviewed nursing notes and prior charts for additional history. External reports reviewed.   LR bolus. Zofran iv.   Cardiac monitor: sinus rhythm, rate 66.  Labs reviewed/interpreted by me - preg neg. Ua w ketones - ivf. Chem normal. Wbc and hgb normal.   Recent outpatient u/s report reviewed by me - no gallstones.   No recurrent emesis in ED.  Has appt w gi upcoming. Given chronicity of symptoms, suspect possible CHS - info provided.   Rx for home.   Return precautions provided.          Final Clinical Impression(s) / ED Diagnoses Final diagnoses:  Nausea and vomiting in adult  Dehydration    Rx / DC Orders ED Discharge Orders     None         Lajean Saver, MD 03/27/22 1737

## 2022-04-24 ENCOUNTER — Other Ambulatory Visit: Payer: Self-pay | Admitting: Neurology

## 2022-04-24 DIAGNOSIS — G35 Multiple sclerosis: Secondary | ICD-10-CM

## 2022-04-27 ENCOUNTER — Ambulatory Visit (HOSPITAL_COMMUNITY): Payer: Self-pay | Admitting: Psychiatry

## 2022-05-08 ENCOUNTER — Ambulatory Visit (HOSPITAL_BASED_OUTPATIENT_CLINIC_OR_DEPARTMENT_OTHER): Payer: 59 | Admitting: Psychiatry

## 2022-05-08 ENCOUNTER — Encounter (HOSPITAL_COMMUNITY): Payer: Self-pay | Admitting: Psychiatry

## 2022-05-08 VITALS — BP 117/79 | HR 85 | Ht 62.0 in | Wt 167.0 lb

## 2022-05-08 DIAGNOSIS — F411 Generalized anxiety disorder: Secondary | ICD-10-CM | POA: Diagnosis not present

## 2022-05-08 DIAGNOSIS — G35 Multiple sclerosis: Secondary | ICD-10-CM

## 2022-05-08 NOTE — Progress Notes (Signed)
Psychiatric Initial Adult Assessment   Patient Identification: Chelsea Gentry MRN:  [id number] Date of Evaluation:  05/08/2022 Referral Source: PCP Chief Complaint:   Chief Complaint  Patient presents with   Follow-up   Visit Diagnosis:    ICD-10-CM   1. Multiple sclerosis (HCC)  G35     2. GAD (generalized anxiety disorder)  F41.1        Assessment:  [name] is a 42 y.o. female with a history of anxiety, MS, graves disease, and reported ADD who presents in person to [facility] for initial evaluation on 05/08/2022.    Patient reports symptoms of anxiety including feeling nervous, being unable to stop or control her worrying, worrying too much about different things, difficulty relaxing, and increased irritability. Patient also reported being diagnosed with ADD in the past, though denied any formal testing. She endorsed symptoms of poor concentration and focus in conjunction with this. Of note patient was recently diagnosed with Graves disease and is still in the process of medication adjustment to manage the condition. In addition she reported daily marijuana usage. At this time patient meets criteria for GAD with ADHD needing further evaluation due to confounding variables of her anxiety symptoms, marijuana use, and graves disease. We would recommend patient undergo neuropsych testing.   A number of assessments were performed during the evaluation today including  PHQ-9 which they scored a 15 on, GAD-7 which they scored a 16 on, and [place] suicide severity screening which showed no risk.   Plan: - Continue Zoloft 50 mg QD, could increase in future - Continue BuSpar 10 BID, could increase in the future - Continue Methimazole 5 g QD - Neuropsych testing referral - Crisis resources reviewed - Follow up as needed  History of Present Illness: Patient presents reporting that she had initially made the appointment during a period of  depression which has since resolved.  She notes that she is connected with psychiatric provider and had a therapist but was looking for a second opinion in addition to hoping she could find a provider that would take care of both things at once.  Patient's past psychiatric history was reviewed and she reports that she has struggled with anxiety and depression for an extended period of time.  In the past she had reached out for help and had been stabilized on Zoloft however after a few years she had felt that this was no longer needed and discontinued the medication.  Last year the symptoms started to get bad again and she reached out last year the symptoms started to get bad again and she reached out for provider and connected with [facility]  There they tried a few different medications for her anxiety/depression before eventually setting on Zoloft which she had taken in the past.  Patient had also been started on BuSpar for anxiety symptoms.  She does find the Zoloft to be helpful and is unsure about the BuSpar.  Patient notes that while the anxiety is improved there is still some residual anxiety and she is unsure whether this is a normal level or not.  Currently she does endorse symptoms of feeling nervous, being unable to stop or control her worrying, worrying too much about different things, difficulty relaxing, and increased irritability.  In addition to the anxiety patient also felt she had symptoms of ADD and her provider had tried numerous medications for these symptoms.  Chelsea Gentry notes that while she did try a  couple medications with some giving some improvement they also had adverse side effects including palpitations and nausea leading to them being discontinued.  Around a month ago she developed significant depressive symptoms including low mood, anhedonia, sleep difficulties, poor appetite, negative self thoughts, and difficulty concentrating.  The symptoms lasted for 4 days and  patient was considering presenting to a hospital.  She denies any active SI but did have passive thoughts of SI around that time.  The symptoms improved and patient found out shortly after that she had hyperthyroidism.  After workup she was diagnosed with Graves' disease.  The medication management for this is still in the process of being adjusted however her mood and anxiety symptoms have already improved significantly since starting the medications.  Her psychiatric provider suggested holding off on making any changes until the thyroid symptoms are managed due to the overlap in symptoms.  We did explain to the patient that hyperthyroidism can cause significant symptoms of anxiety and also overlap with symptoms of ADHD.  We recommended neuropsych testing to get further clarity into her diagnosis.  We also explained that while this provider would be open to therapy during the sessions the frequency of appointments would not be sufficient for proper therapy.  It also would not make sense for patient to have 2 different psychiatric providers.  After explaining this patient felt that it may be better to stick with the provider at [facility] as her therapist is located there and is in close proximity to her work.  She was interested in referral to neuropsych testing for further clarity into her diagnosis/ADHD.  Associated Signs/Symptoms: Depression Symptoms:  insomnia, fatigue, difficulty concentrating, anxiety, loss of energy/fatigue, disturbed sleep, (Hypo) Manic Symptoms:  Irritable Mood, Anxiety Symptoms:  Excessive Worry, Psychotic Symptoms:   Denies PTSD Symptoms: NA  Past Psychiatric History: Patient denies any prior psychiatric hospitalizations or past suicide attempts.  Had seen a few different providers in the past.  She is currently connected with a PA at [facility] who been managing her psychiatric medications.  She also had a therapist at a caf whom she discontinued with due to time  constraints.  Has tried Ritalin, adderall, Vyvanse, Strattera, Qelbree, cymbalta, trintellix in the past.  Patient reports intermittent alcohol use around once a month.  She also reports daily marijuana usage.  Patient denied any other substance use besides cigarettes.  Previous Psychotropic Medications: Yes   Substance Abuse History in the last 12 months:  Yes.    Consequences of Substance Abuse: Negative  Past Medical History:  Past Medical History:  Diagnosis Date   Anxiety    Dysmenorrhea    Multiple sclerosis (HCC)    Seizures (HCC)    Vision abnormalities     Past Surgical History:  Procedure Laterality Date   DILATION AND EVACUATION N/A 12/18/2013   Procedure: DILATATION AND EVACUATION Ultrasound guided;  Surgeon: Dorien Chihuahua. Richardson Dopp, MD;  Location: [facility];  Service: Gynecology;  Laterality: N/A;   TONSILLECTOMY      Family Psychiatric History: Denied  Family History:  Family History  Problem Relation Age of Onset   Liver cancer Mother    Heart disease Father    AAA (abdominal aortic aneurysm) Father    Hypertension Father    High Cholesterol Father    Atrial fibrillation Father    Healthy Brother     Social History:   Social History   Socioeconomic History   Marital status: Married    Spouse name: Not on file  Number of children: Not on file   Years of education: Not on file   Highest education level: Not on file  Occupational History   Not on file  Tobacco Use   Smoking status: Every Day    Packs/day: .5    Types: Cigarettes   Smokeless tobacco: Never  Substance and Sexual Activity   Alcohol use: Yes    Comment: seldom   Drug use: Yes    Types: Marijuana    Comment: Almost Daily   Sexual activity: Yes  Other Topics Concern   Not on file  Social History Narrative   Not on file   Social Determinants of Health   Financial Resource Strain: Not on file  Food Insecurity: Not on file  Transportation Needs: Not on file  Physical Activity: Not on  file  Stress: Not on file  Social Connections: Not on file    Additional Social History: Patient lives with her husband.  Allergies:   Allergies  Allergen Reactions   Shellfish Allergy Anaphylaxis    Metabolic Disorder Labs: No results found for: "HGBA1C", "MPG" Lab Results  Component Value Date   PROLACTIN 8.8 04/16/2011   No results found for: "CHOL", "TRIG", "HDL", "CHOLHDL", "VLDL", "LDLCALC" Lab Results  Component Value Date   TSH 2.720 06/19/2016    Therapeutic Level Labs: No results found for: "LITHIUM" No results found for: "CBMZ" No results found for: "VALPROATE"  Current Medications: Current Outpatient Medications  Medication Sig Dispense Refill   ALPRAZolam (XANAX) 0.5 MG tablet Take 1 tablet (0.5 mg total) by mouth once as needed for up to 1 dose for anxiety (MRI). 2 tablet 0   busPIRone (BUSPAR) 10 MG tablet Take 10 mg by mouth 2 (two) times daily.     Cholecalciferol (VITAMIN D PO) Take 7,000 Units by mouth daily.     Diroximel Fumarate (VUMERITY) 231 MG CPDR TAKE 2 CAPSULES (462MG ) BY MOUTH TWICE DAILY 120 capsule 2   DULoxetine (CYMBALTA) 30 MG capsule Take 90 mg by mouth daily.     montelukast (SINGULAIR) 10 MG tablet Take 10 mg by mouth at bedtime.     rizatriptan (MAXALT) 10 MG tablet Take 1 tablet (10 mg total) by mouth as needed for migraine. May repeat in 2 hours if needed 10 tablet 11   No current facility-administered medications for this visit.    Musculoskeletal: Strength & Muscle Tone: within normal limits Gait & Station: normal Patient leans: N/A  Psychiatric Specialty Exam: Review of Systems  Blood pressure 117/79, pulse 85, height 5\' 2"  (1.575 m), weight 167 lb (75.8 kg), unknown if currently breastfeeding.Body mass index is 30.54 kg/m.  General Appearance: Fairly Groomed  Eye Contact:  Minimal  Speech:  Clear and Coherent and Normal Rate  Volume:  Normal  Mood:  Anxious and Euthymic  Affect:  Congruent  Thought Process:   Coherent  Orientation:  Full (Time, Place, and Person)  Thought Content:  Logical  Suicidal Thoughts:  No  Homicidal Thoughts:  No  Memory:  Immediate;   Good  Judgement:  Fair  Insight:  Fair  Psychomotor Activity:  Normal  Concentration:  Concentration: Good  Recall:  Good  Fund of Knowledge:Fair  Language: Good  Akathisia:  NA    AIMS (if indicated):  not done  Assets:  Systems developer Vocational/Educational  ADL's:  Intact  Cognition: WNL  Sleep:  Fair   Screenings: Flowsheet Row ED from 03/27/2022 in [facility] Emergency Department at  [address]  C-SSRS RISK CATEGORY No Risk        Collaboration of Care: Primary Care Provider AEB chart review  70 minutes were spent in chart review, interview, psycho education, counseling, medical decision making, coordination of care and long-term prognosis.  Patient was given opportunity to ask question and all concerns and questions were addressed and answers. Excluding separately billable services.   Patient/Guardian was advised Release of Information must be obtained prior to any record release in order to collaborate their care with an outside provider. Patient/Guardian was advised if they have not already done so to contact the registration department to sign all necessary forms in order for [place] to release information regarding their care.   Consent: Patient/Guardian gives verbal consent for treatment and assignment of benefits for services provided during this visit. Patient/Guardian expressed understanding and agreed to proceed.   Stasia Cavalier, MD 5/1/202411:56 AM

## 2022-05-09 ENCOUNTER — Telehealth (HOSPITAL_COMMUNITY): Payer: Self-pay | Admitting: *Deleted

## 2022-05-09 NOTE — Telephone Encounter (Signed)
Referrals for neuropsych testing sent to [facility] and [facility].

## 2022-05-13 ENCOUNTER — Encounter (HOSPITAL_COMMUNITY): Payer: Self-pay

## 2022-06-17 ENCOUNTER — Ambulatory Visit (INDEPENDENT_AMBULATORY_CARE_PROVIDER_SITE_OTHER): Payer: 59 | Admitting: Family Medicine

## 2022-06-17 ENCOUNTER — Encounter: Payer: Self-pay | Admitting: Family Medicine

## 2022-06-17 VITALS — BP 138/80 | HR 90 | Ht 62.0 in | Wt 169.8 lb

## 2022-06-17 DIAGNOSIS — G35 Multiple sclerosis: Secondary | ICD-10-CM

## 2022-06-17 DIAGNOSIS — R7989 Other specified abnormal findings of blood chemistry: Secondary | ICD-10-CM

## 2022-06-17 DIAGNOSIS — F32A Depression, unspecified: Secondary | ICD-10-CM

## 2022-06-17 DIAGNOSIS — Z79899 Other long term (current) drug therapy: Secondary | ICD-10-CM | POA: Diagnosis not present

## 2022-06-17 DIAGNOSIS — G43009 Migraine without aura, not intractable, without status migrainosus: Secondary | ICD-10-CM | POA: Diagnosis not present

## 2022-06-17 DIAGNOSIS — F419 Anxiety disorder, unspecified: Secondary | ICD-10-CM

## 2022-06-17 NOTE — Progress Notes (Signed)
Chief Complaint  Patient presents with   Follow-up    RM 1, alone. Last seen 12/05/21. MS DMT: Vumerity, tolerating well.  Dx w/ Graves disease 01/2022. Endocrinologist: Dr. Shawnee Knapp [facility]). They are still adjusting medications.  No neurological concerns. Last eye appt: Feb/March 2024- cannot remember who she went to. Exam was good, no concerns at the time.    HISTORY OF PRESENT ILLNESS:  06/17/22 ALL:  Chelsea Gentry is a 42 y.o. female here today for follow up for RRMS on Vumerity. MRI cervical spine and brain stable 03/2020. Labs have been normal. Last MRI brain 02/2022 was stable.   She feels that she is doing fairly well. No new or worsening symptoms. Gait is stable. She is working with endocrinology for Graves Disease. She is now on methimazole.   Migraines are easily aborted with rizatriptan. Se can not remember the last time she had to take it.   She feels mood is stable. She stopped Adderall due to elevated heart rate. She is working with [facility] for ADHD and mood management. She feels mood is good on sertraline and buspirone. She is sleeping well.    She is having urge incontinence but feels it is a little better, recently. She is having more diarrhea with adjustments of thyroid meds.   She continues vitamin D supplements, 7000iu daily. She was seen by Lafonda Mosses with [facility] in 02/2022. Exam was normal.   HISTORY (copied from Dr Bonnita Hollow previous note)   Chelsea Gentry is a 42 y.o. woman with relapsing remitting multiple sclerosis diagnosed in 2016    Update 12/05/2021: She has been on Vumerity since 2021, switching from Tecfidera/DM due to copay issues.   She tolerates it well.  No GI issues.  She has rare flushing, much less than initially.    She has no exacerbations.    Existing symptoms are stable,   Gait and balance are doing well.  She has no trouble with stairs and does not need the bannister.   She miht be able to walk one hor straight.   Hand  numbness resolved since her CTR for bilateral CTS (Dr. Yehuda Budd).    Vision is stable.   Bladder has occasioanal stress incontinence.   She has never taken anything for it and would prefer to hold off for now.      She has some fatigue.  She has some depression and anxiety.   She is on Cymbalta, Buspar. 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    Adderall 20 mg IR bid helped but he felt mean.  However, Ritalin and Strattera did not help as much and she may go back.     She has migraine headaches, but these are less common, averages 1/month.   Rizatriptan helps.    She gets nausea at times.      MS History: She was diagnosed with MS in October 2016. In retrospect, about 6 or 7 years ago she had numbness in her hands more than her feet that was moderate for several weeks before it improved. At that time, she was found to have a low vitamin D and that was blamed for the incident.  In late September 2016, she presented with left visual blurring.    She had an MRI of the brain consistent with MS.   She received po steroids and did better    Her headaches associated with the ON also improved.    She was first on  Copaxone but had swithced   She had 5 seizures over 3-4 years around ages [age] but none the past 14-15 years.   Poor sleep may have triggered some of the seizures.   One occurred after taking a diet energy pill.      IMAGING  MRIs of the brain from 10/11/2014, 10/13/2014 and 05/20/2016. The MRIs from October 2016 showed enhancement of the left optic nerve consistent with optic neuritis. Additionally, there were multiple T2/FLAIR hyperintense foci in the periventricular, juxtacortical and deep white matter and also one small one in the right cerebellar hemisphere. These were consistent with a diagnosis of multiple sclerosis. The MRI from 05/20/2016 shows the chronic lesions and additionally shows at least 6 new lesions in the periventricular, deep and juxtacortical white matter with 2 enhancing  lesions.     MRI of the cervical spine 08/22/2017 showed a focus to the right at [facility].and possible focus at T1.     MRI of the brain 08/24/2017 showed  Multiple T2/FLAIR hyperintense foci in the hemispheres and the pattern and configuration consistent with chronic demyelinating plaque associated with multiple sclerosis.  None of the foci appears to be acute.  When compared to the MRI dated 10/11/2014, there are a couple more foci on the current MRI.   MRI of the brain 12/18/2018 showed multiple T2/flair hyperintense foci in the hemispheres and 1 in the upper spinal cord in a pattern and configuration consistent with chronic demyelinating plaque associated with multiple sclerosis.  None of the foci appears to be acute.  Compared to the MRI dated 08/22/2017, there are no new lesions.   MR of the brain 03/17/2020 showed Multiple T2 hyperintense foci in the hemispheres and upper spinal cord in a pattern and configuration consistent with chronic demyelinating plaque associated with multiple sclerosis.  None of the foci appear to be acute.  Compared to the MRI dated 12/18/2018, there are no new lesions.   MRI of the cervical spine 03/17/2020 showed  T2 hyperintense focus towards the right adjacent to C2-C3 unchanged compared to the previous MRI from 08/22/2017 and consistent with a chronic demyelinating plaque associated with multiple sclerosis.      Disc protrusion at C5-C6 has progressed compared to the previous MRI but does not lead to spinal stenosis or nerve root compression    REVIEW OF SYSTEMS: Out of a complete 14 system review of symptoms, the patient complains only of the following symptoms, headaches, depression, anxiety,  and all other reviewed systems are negative.    ALLERGIES: Allergies  Allergen Reactions   Shellfish Allergy Anaphylaxis     HOME MEDICATIONS: Outpatient Medications Prior to Visit  Medication Sig Dispense Refill   busPIRone (BUSPAR) 10 MG tablet Take 10 mg by mouth 2  (two) times daily.     Cholecalciferol (VITAMIN D PO) Take 7,000 Units by mouth daily.     Diroximel Fumarate (VUMERITY) 231 MG CPDR TAKE 2 CAPSULES (462MG ) BY MOUTH TWICE DAILY 120 capsule 2   methimazole (TAPAZOLE) 5 MG tablet Take 7.5 mg by mouth daily.     montelukast (SINGULAIR) 10 MG tablet Take 10 mg by mouth at bedtime.     omeprazole (PRILOSEC) 40 MG capsule Take 40 mg by mouth daily.     rizatriptan (MAXALT) 10 MG tablet Take 1 tablet (10 mg total) by mouth as needed for migraine. May repeat in 2 hours if needed 10 tablet 11   sertraline (ZOLOFT) 50 MG tablet Take 50 mg by mouth daily.  ALPRAZolam (XANAX) 0.5 MG tablet Take 1 tablet (0.5 mg total) by mouth once as needed for up to 1 dose for anxiety (MRI). 2 tablet 0   DULoxetine (CYMBALTA) 30 MG capsule Take 90 mg by mouth daily.     No facility-administered medications prior to visit.     PAST MEDICAL HISTORY: Past Medical History:  Diagnosis Date   Anxiety    Dysmenorrhea    Multiple sclerosis (HCC)    Seizures (HCC)    Vision abnormalities      PAST SURGICAL HISTORY: Past Surgical History:  Procedure Laterality Date   DILATION AND EVACUATION N/A 12/18/2013   Procedure: DILATATION AND EVACUATION Ultrasound guided;  Surgeon: Dorien Chihuahua. Richardson Dopp, MD;  Location: [facility];  Service: Gynecology;  Laterality: N/A;   TONSILLECTOMY       FAMILY HISTORY: Family History  Problem Relation Age of Onset   Liver cancer Mother    Heart disease Father    AAA (abdominal aortic aneurysm) Father    Hypertension Father    High Cholesterol Father    Atrial fibrillation Father    Healthy Brother      SOCIAL HISTORY: Social History   Socioeconomic History   Marital status: Married    Spouse name: Not on file   Number of children: Not on file   Years of education: Not on file   Highest education level: Not on file  Occupational History   Not on file  Tobacco Use   Smoking status: Every Day    Packs/day: .5    Types:  Cigarettes   Smokeless tobacco: Never  Substance and Sexual Activity   Alcohol use: Yes    Comment: seldom   Drug use: Yes    Types: Marijuana    Comment: Almost Daily   Sexual activity: Yes  Other Topics Concern   Not on file  Social History Narrative   Not on file   Social Determinants of Health   Financial Resource Strain: Not on file  Food Insecurity: Not on file  Transportation Needs: Not on file  Physical Activity: Not on file  Stress: Not on file  Social Connections: Not on file  Intimate Partner Violence: Not on file      PHYSICAL EXAM  Vitals:   06/17/22 1357  BP: 138/80  Pulse: 90  Weight: 169 lb 12.8 oz (77 kg)  Height: 5\' 2"  (1.575 m)     Body mass index is 31.06 kg/m.   Generalized: Well developed, in no acute distress  Cardiology: normal rate and rhythm, no murmur auscultated  Respiratory: clear to auscultation bilaterally    Neurological examination  Mentation: Alert oriented to time, place, history taking. Follows all commands speech and language fluent Cranial nerve II-XII: Pupils were equal round reactive to light. Extraocular movements were full, visual field were full on confrontational test. Facial sensation and strength were normal. Head turning and shoulder shrug  were normal and symmetric. Motor: The motor testing reveals 5 over 5 strength of all 4 extremities. Good symmetric motor tone is noted throughout.  Sensory: Sensory testing is intact to soft touch on all 4 extremities. No evidence of extinction is noted.  Coordination: Cerebellar testing reveals good finger-nose-finger and heel-to-shin bilaterally.  Gait and station: Gait is normal.  Reflexes: Deep tendon reflexes are symmetric and normal bilaterally.     DIAGNOSTIC DATA (LABS, IMAGING, TESTING) - I reviewed patient records, labs, notes, testing and imaging myself where available.  Lab Results  Component Value Date  WBC 9.9 03/27/2022   HGB 15.3 (H) 03/27/2022   HCT  45.3 03/27/2022   MCV 85.3 03/27/2022   PLT 239 03/27/2022      Component Value Date/Time   NA 137 03/27/2022 1420   NA 139 12/05/2021 1607   K 3.7 03/27/2022 1420   CL 105 03/27/2022 1420   CO2 22 03/27/2022 1420   GLUCOSE 90 03/27/2022 1420   BUN 6 03/27/2022 1420   BUN 7 12/05/2021 1607   CREATININE 0.66 03/27/2022 1420   CALCIUM 9.7 03/27/2022 1420   PROT 7.0 03/27/2022 1420   PROT 6.5 12/05/2021 1607   ALBUMIN 4.7 03/27/2022 1420   ALBUMIN 4.3 12/05/2021 1607   AST 16 03/27/2022 1420   ALT 15 03/27/2022 1420   ALKPHOS 49 03/27/2022 1420   BILITOT 0.5 03/27/2022 1420   BILITOT 0.4 12/05/2021 1607   GFRNONAA >60 03/27/2022 1420   No results found for: "CHOL", "HDL", "LDLCALC", "LDLDIRECT", "TRIG", "CHOLHDL" No results found for: "HGBA1C" No results found for: "VITAMINB12" Lab Results  Component Value Date   TSH 2.720 06/19/2016        No data to display               No data to display           ASSESSMENT AND PLAN  42 y.o. year old female  has a past medical history of Anxiety, Dysmenorrhea, Multiple sclerosis (HCC), Seizures (HCC), and Vision abnormalities. here with     Multiple sclerosis (HCC) - Plan: CBC with Differential/Platelets, CMP  High risk medication use  Migraine without aura and without status migrainosus, not intractable  Low vitamin D level - Plan: Vitamin D, 25-hydroxy  Anxiety and depression  Chelsea Gentry is doing well, today. We will continue Vumerity. I will update labs. She will continue rizatriptan PRN. Healthy lifestyle habits encouraged. Continue close follow up with care team. She will follow up with Dr Epimenio Foot in 6 months, sooner if needed.   Orders Placed This Encounter  Procedures   CBC with Differential/Platelets   CMP   Vitamin D, 25-hydroxy     No orders of the defined types were placed in this encounter.    Shawnie Dapper, MSN, FNP-C 06/17/2022, 2:28 PM  [facility] [address], [address] [place], [place] [address] [phone]

## 2022-06-17 NOTE — Patient Instructions (Signed)

## 2022-06-18 LAB — COMPREHENSIVE METABOLIC PANEL
ALT: 12 IU/L (ref 0–32)
AST: 13 IU/L (ref 0–40)
Albumin/Globulin Ratio: 2.2
Albumin: 4.3 g/dL (ref 3.9–4.9)
Alkaline Phosphatase: 65 IU/L (ref 44–121)
BUN/Creatinine Ratio: 13 (ref 9–23)
BUN: 9 mg/dL (ref 6–20)
Bilirubin Total: 0.3 mg/dL (ref 0.0–1.2)
CO2: 24 mmol/L (ref 20–29)
Calcium: 9.2 mg/dL (ref 8.7–10.2)
Chloride: 103 mmol/L (ref 96–106)
Creatinine, Ser: 0.67 mg/dL (ref 0.57–1.00)
Globulin, Total: 2 g/dL (ref 1.5–4.5)
Glucose: 114 mg/dL — ABNORMAL HIGH (ref 70–99)
Potassium: 4 mmol/L (ref 3.5–5.2)
Sodium: 138 mmol/L (ref 134–144)
Total Protein: 6.3 g/dL (ref 6.0–8.5)
eGFR: 114 mL/min/{1.73_m2} (ref >59)

## 2022-06-18 LAB — CBC WITH DIFFERENTIAL/PLATELET
Basophils Absolute: 0.1 10*3/uL (ref 0.0–0.2)
Basos: 1 %
EOS (ABSOLUTE): 0.1 10*3/uL (ref 0.0–0.4)
Eos: 1 %
Hematocrit: 39.3 % (ref 34.0–46.6)
Hemoglobin: 13.9 g/dL (ref 11.1–15.9)
Immature Grans (Abs): 0 10*3/uL (ref 0.0–0.1)
Immature Granulocytes: 0 %
Lymphocytes Absolute: 2.4 10*3/uL (ref 0.7–3.1)
Lymphs: 33 %
MCH: 29.6 pg (ref 26.6–33.0)
MCHC: 35.4 g/dL (ref 31.5–35.7)
MCV: 84 fL (ref 79–97)
Monocytes Absolute: 0.6 10*3/uL (ref 0.1–0.9)
Monocytes: 8 %
Neutrophils Absolute: 4.2 10*3/uL (ref 1.4–7.0)
Neutrophils: 57 %
Platelets: 298 10*3/uL (ref 150–450)
RBC: 4.7 x10E6/uL (ref 3.77–5.28)
RDW: 11.5 % — ABNORMAL LOW (ref 11.7–15.4)
WBC: 7.4 10*3/uL (ref 3.4–10.8)

## 2022-06-18 LAB — VITAMIN D 25 HYDROXY (VIT D DEFICIENCY, FRACTURES): Vit D, 25-Hydroxy: 61.9 ng/mL (ref 30.0–100.0)

## 2022-07-19 ENCOUNTER — Other Ambulatory Visit: Payer: Self-pay | Admitting: Neurology

## 2022-07-19 DIAGNOSIS — G35 Multiple sclerosis: Secondary | ICD-10-CM

## 2022-07-23 NOTE — Telephone Encounter (Signed)
Last seen on 06/17/22 Follow up scheduled on 01/16/23

## 2022-08-14 DIAGNOSIS — E0500 Thyrotoxicosis with diffuse goiter without thyrotoxic crisis or storm: Secondary | ICD-10-CM | POA: Insufficient documentation

## 2022-09-17 ENCOUNTER — Encounter: Payer: Self-pay | Admitting: Family Medicine

## 2022-09-18 ENCOUNTER — Other Ambulatory Visit: Payer: Self-pay

## 2022-09-18 MED ORDER — RIZATRIPTAN BENZOATE 10 MG PO TABS: 10.0000 mg | ORAL_TABLET | ORAL | 11 refills | Status: DC | PRN

## 2022-09-26 ENCOUNTER — Other Ambulatory Visit: Payer: Self-pay | Admitting: Neurology

## 2022-09-26 DIAGNOSIS — G35 Multiple sclerosis: Secondary | ICD-10-CM

## 2022-09-26 NOTE — Telephone Encounter (Signed)
Last seen on 06/17/22 Follow up scheduled on 01/16/23

## 2022-10-23 ENCOUNTER — Telehealth: Payer: Self-pay

## 2022-10-23 ENCOUNTER — Telehealth: Payer: Self-pay | Admitting: *Deleted

## 2022-10-23 ENCOUNTER — Other Ambulatory Visit (HOSPITAL_COMMUNITY): Payer: Self-pay

## 2022-10-23 NOTE — Telephone Encounter (Signed)
Dr. Epimenio Foot- can you review to confirm? I see she's tried copaxone (needle fatigue/skin reactions) and Tecfidera/dimethyl fumarate. Any reason she cannot try others listed below?   She has been on Vumerity since 2021.

## 2022-10-23 NOTE — Telephone Encounter (Signed)
   Insurance is asking the above questions-I wanted to double check to see if I missed anything-looks like before Vumerity PT had only tried and failed one medication. Does PT have a contraindication to any of the above meds? Please advise

## 2022-10-23 NOTE — Telephone Encounter (Signed)
Received fax from [employer] that PA needed for Vumerity.  Plan phone# [phone].  CMM key: [id number]

## 2022-10-28 ENCOUNTER — Encounter: Payer: Self-pay | Admitting: Neurology

## 2022-10-28 ENCOUNTER — Encounter: Payer: Self-pay | Admitting: *Deleted

## 2022-10-28 NOTE — Telephone Encounter (Signed)
Pharmacy Patient Advocate Encounter   Received notification from CoverMyMeds that prior authorization for VUMERITY 231MG  delayed-release capsules is required/requested.   Insurance verification completed.   The patient is insured through [facility] .   Per test claim: PA required; PA submitted to [facility] via CoverMyMeds Key/confirmation #/EOC [id number] Status is pending

## 2022-10-28 NOTE — Telephone Encounter (Signed)
Faxed appeal letter to [facility] appeals at [phone]. Received fax confirmation, waiting on determination.

## 2022-10-29 NOTE — Telephone Encounter (Signed)
Chelsea Gentry from [facility] Dept reports there is an approval for the VUMERITY 231 MG CPDR  for 12 months from today until 10-29-23 if there are questions the call back # is [phone]

## 2022-10-30 ENCOUNTER — Other Ambulatory Visit (HOSPITAL_COMMUNITY): Payer: Self-pay

## 2022-10-31 NOTE — Telephone Encounter (Signed)
See pt message from 10/28/22. Approval info already listed there, pt aware

## 2022-10-31 NOTE — Telephone Encounter (Signed)
   Looks like the appeal was approved-did not send any approval info such as dates of approval or approval reference number.

## 2022-11-28 ENCOUNTER — Other Ambulatory Visit: Payer: Self-pay | Admitting: Neurology

## 2022-11-28 DIAGNOSIS — G35 Multiple sclerosis: Secondary | ICD-10-CM

## 2022-11-28 NOTE — Telephone Encounter (Signed)
Last seen on 06/17/22 Follow up scheduled on 01/16/23

## 2023-01-16 ENCOUNTER — Encounter: Payer: Self-pay | Admitting: Neurology

## 2023-01-16 ENCOUNTER — Ambulatory Visit (INDEPENDENT_AMBULATORY_CARE_PROVIDER_SITE_OTHER): Payer: 59 | Admitting: Neurology

## 2023-01-16 VITALS — BP 146/86 | HR 88 | Ht 62.0 in | Wt 186.0 lb

## 2023-01-16 DIAGNOSIS — G5603 Carpal tunnel syndrome, bilateral upper limbs: Secondary | ICD-10-CM

## 2023-01-16 DIAGNOSIS — R7989 Other specified abnormal findings of blood chemistry: Secondary | ICD-10-CM

## 2023-01-16 DIAGNOSIS — F32A Depression, unspecified: Secondary | ICD-10-CM

## 2023-01-16 DIAGNOSIS — G5763 Lesion of plantar nerve, bilateral lower limbs: Secondary | ICD-10-CM

## 2023-01-16 DIAGNOSIS — G35 Multiple sclerosis: Secondary | ICD-10-CM

## 2023-01-16 DIAGNOSIS — F419 Anxiety disorder, unspecified: Secondary | ICD-10-CM

## 2023-01-16 DIAGNOSIS — G43009 Migraine without aura, not intractable, without status migrainosus: Secondary | ICD-10-CM | POA: Diagnosis not present

## 2023-01-16 DIAGNOSIS — Z79899 Other long term (current) drug therapy: Secondary | ICD-10-CM

## 2023-01-16 DIAGNOSIS — G43909 Migraine, unspecified, not intractable, without status migrainosus: Secondary | ICD-10-CM

## 2023-01-16 MED ORDER — GABAPENTIN 300 MG PO CAPS: 300.0000 mg | ORAL_CAPSULE | Freq: Three times a day (TID) | ORAL | 11 refills | Status: DC

## 2023-01-16 NOTE — Progress Notes (Signed)
 [facility]  PATIENT: Chelsea Gentry DOB: 1980/03/01  REFERRING DOCTOR OR PCP:  Referred by Dr. Cleotilde ([facility]);   Madolyn Monte, PA-C [facility]) SOURCE: patient, notes from Dr. Cleotilde, labs and imaging reports since 2016,   _________________________________   HISTORICAL  CHIEF COMPLAINT:  Chief Complaint  Patient presents with   Follow-up    Pt in room 10 alone. Here for MS follow up on Vumerity . Pt reports doing well.    HISTORY OF PRESENT ILLNESS:   Chelsea Gentry is a 43 y.o. woman with relapsing remitting multiple sclerosis diagnosed in 2016   Update 01/16/2023: She has been on Vumerity  since 2021, switching from Tecfidera /DM due to copay issues.   She tolerates it well.  No GI issues.  She has rare flushing, much less than initially.    She has no exacerbations.    Existing neurologic symptoms  are stable,  Gait and balance are doing well.  She has no trouble with stairs and does not need the bannister but sometimes uses it.  Feels a litle off on unbalanced surfaces.    Hand numbness resolved since her [facility] for bilateral CTS (Dr. Alyse).    She has some pain in her feet.  Vision is stable.   Bladder has occasioanal stress incontinence.   She has never taken anything for it and would prefer to hold off for now.     She has some fatigue.  She has some depression and anxiety.   She is on Cymbalta , Buspar. 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    Adderall 20 mg IR bid helped but he felt mean.  However, Ritalin and Strattera did not help as much and she may go back.    She has migraine headaches, but these are less common, averages 1/month.   Rizatriptan  helps.    She gets nausea at times.     She ws diagnosed with Graves disease and her thyroid function improvd.  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     MS History: She was diagnosed with MS in October 2016. In retrospect, about 6 or 7 years ago she had numbness  in her hands more than her feet that was moderate for several weeks before it improved. At that time, she was found to have a low vitamin D  and that was blamed for the incident.  In late September 2016, she presented with left visual blurring.    She had an MRI of the brain consistent with MS.   She received po steroids and did better    Her headaches associated with the ON also improved.    She was first on Copaxone  but had swithced  She had 5 seizures over 3-4 years around ages [age] but none the past 14-15 years.   Poor sleep may have triggered some of the seizures.   One occurred after taking a diet energy pill.      IMAGING  MRIs of the brain from 10/11/2014, 10/13/2014 and 05/20/2016. The MRIs from October 2016 showed enhancement of the left optic nerve consistent with optic neuritis. Additionally, there were multiple T2/FLAIR hyperintense foci in the periventricular, juxtacortical and deep white matter and also one small one in the right cerebellar hemisphere. These were consistent with a diagnosis of multiple sclerosis. The MRI from 05/20/2016 shows the chronic lesions and additionally shows at least 6 new lesions in the periventricular, deep and juxtacortical white matter with 2 enhancing  lesions.    MRI of the cervical spine 08/22/2017 showed a focus to the right at [facility].and possible focus at T1.    MRI of the brain 08/24/2017 showed  Multiple T2/FLAIR hyperintense foci in the hemispheres and the pattern and configuration consistent with chronic demyelinating plaque associated with multiple sclerosis.  None of the foci appears to be acute.  When compared to the MRI dated 10/11/2014, there are a couple more foci on the current MRI.  MRI of the brain 12/18/2018 showed multiple T2/flair hyperintense foci in the hemispheres and 1 in the upper spinal cord in a pattern and configuration consistent with chronic demyelinating plaque associated with multiple sclerosis.  None of the foci appears to be acute.   Compared to the MRI dated 08/22/2017, there are no new lesions.  MR of the brain 03/17/2020 showed Multiple T2 hyperintense foci in the hemispheres and upper spinal cord in a pattern and configuration consistent with chronic demyelinating plaque associated with multiple sclerosis.  None of the foci appear to be acute.  Compared to the MRI dated 12/18/2018, there are no new lesions.  MRI of the cervical spine 03/17/2020 showed  T2 hyperintense focus towards the right adjacent to C2-C3 unchanged compared to the previous MRI from 08/22/2017 and consistent with a chronic demyelinating plaque associated with multiple sclerosis.      Disc protrusion at C5-C6 has progressed compared to the previous MRI but does not lead to spinal stenosis or nerve root compression  MRI brain 02/08/22 showed no new lesions compared to 06/18/2021   REVIEW OF SYSTEMS: Constitutional: No fevers, chills, sweats, or change in appetite.   Some fatigue Eyes: No visual changes, double vision, eye pain Ear, nose and throat: No hearing loss, ear pain, nasal congestion, sore throat Cardiovascular: No chest pain, palpitations Respiratory:  No shortness of breath at rest or with exertion.   No wheezes GastrointestinaI: No nausea, vomiting, diarrhea, abdominal pain, fecal incontinence Genitourinary:  No dysuria, urinary retention or frequency.  No nocturia. Musculoskeletal:  No neck pain, back pain Integumentary: No rash, pruritus, skin lesions Neurological: as above Psychiatric: No depression at this time.  No anxiety Endocrine: No palpitations, diaphoresis, change in appetite, change in weigh or increased thirst Hematologic/Lymphatic:  No anemia, purpura, petechiae. Allergic/Immunologic: No itchy/runny eyes, nasal congestion, recent allergic reactions, rashes  ALLERGIES: Allergies  Allergen Reactions   Shellfish Allergy Anaphylaxis    HOME MEDICATIONS:  Current Outpatient Medications:    ADDERALL 20 MG tablet, Take 20 mg by  mouth daily., Disp: , Rfl:    busPIRone (BUSPAR) 10 MG tablet, Take 10 mg by mouth 2 (two) times daily., Disp: , Rfl:    Cholecalciferol (VITAMIN D  PO), Take 7,000 Units by mouth daily., Disp: , Rfl:    gabapentin  (NEURONTIN ) 300 MG capsule, Take 1 capsule (300 mg total) by mouth 3 (three) times daily., Disp: 90 capsule, Rfl: 11   methIMAzole (TAPAZOLE) 2.5 MG TABS tablet, Take by mouth daily., Disp: , Rfl:    methimazole (TAPAZOLE) 5 MG tablet, Take 7.5 mg by mouth daily., Disp: , Rfl:    montelukast (SINGULAIR) 10 MG tablet, Take 10 mg by mouth at bedtime., Disp: , Rfl:    omeprazole (PRILOSEC) 40 MG capsule, Take 40 mg by mouth daily., Disp: , Rfl:    rizatriptan  (MAXALT ) 10 MG tablet, Take 1 tablet (10 mg total) by mouth as needed for migraine. May repeat in 2 hours if needed, Disp: 10 tablet, Rfl: 11   sertraline  (ZOLOFT ) 50  MG tablet, Take 50 mg by mouth daily., Disp: , Rfl:    VUMERITY  231 MG CPDR, TAKE 2 CAPSULES (462MG ) BY MOUTH TWICE DAILY, Disp: 120 capsule, Rfl: 1  PAST MEDICAL HISTORY: Past Medical History:  Diagnosis Date   Anxiety    Dysmenorrhea    Multiple sclerosis (HCC)    Seizures (HCC)    Vision abnormalities     PAST SURGICAL HISTORY: Past Surgical History:  Procedure Laterality Date   DILATION AND EVACUATION N/A 12/18/2013   Procedure: DILATATION AND EVACUATION Ultrasound guided;  Surgeon: Rexene PARAS. Rosalva, MD;  Location: [facility];  Service: Gynecology;  Laterality: N/A;   TONSILLECTOMY      FAMILY HISTORY: Family History  Problem Relation Age of Onset   Liver cancer Mother    Heart disease Father    AAA (abdominal aortic aneurysm) Father    Hypertension Father    High Cholesterol Father    Atrial fibrillation Father    Healthy Brother     SOCIAL HISTORY:  Social History   Socioeconomic History   Marital status: Married    Spouse name: Not on file   Number of children: Not on file   Years of education: Not on file   Highest education level: Not on  file  Occupational History   Not on file  Tobacco Use   Smoking status: Every Day    Current packs/day: 0.50    Types: Cigarettes   Smokeless tobacco: Never  Substance and Sexual Activity   Alcohol use: Yes    Comment: seldom   Drug use: Yes    Types: Marijuana    Comment: Almost Daily   Sexual activity: Yes  Other Topics Concern   Not on file  Social History Narrative   Not on file   Social Drivers of Health   Financial Resource Strain: Low Risk  (01/29/2022)   Received from [facility], [facility]   Overall Financial Resource Strain (CARDIA)    Difficulty of Paying Living Expenses: Not hard at all  Food Insecurity: No Food Insecurity (01/29/2022)   Received from [facility], [facility]   Hunger Vital Sign    Worried About Running Out of Food in the Last Year: Never true    Ran Out of Food in the Last Year: Never true  Transportation Needs: No Transportation Needs (01/29/2022)   Received from [facility]   PRAPARE - Transportation    Lack of Transportation (Medical): No    Lack of Transportation (Non-Medical): No  Physical Activity: Unknown (01/29/2022)   Received from [facility], [facility]   Exercise Vital Sign    Days of Exercise per Week: 0 days    Minutes of Exercise per Session: Not on file  Stress: Stress Concern Present (01/29/2022)   Received from [facility], [facility] - Occupational Stress Questionnaire    Feeling of Stress : Rather much  Social Connections: Moderately Integrated (01/29/2022)   Received from [facility], [facility]   Social Network    How would you rate your social network (family, work, friends)?: Adequate participation with social networks  Intimate Partner Violence: Not At Risk (01/29/2022)   Received from [facility], [facility]   HITS    Over the last 12 months how often did your partner physically hurt you?: Never    Over the last 12 months how  often did your partner insult you or talk down to you?: Never  Over the last 12 months how often did your partner threaten you with physical harm?: Never    Over the last 12 months how often did your partner scream or curse at you?: Rarely     PHYSICAL EXAM  Vitals:   01/16/23 1501  BP: (!) 146/86  Pulse: 88  Weight: 186 lb (84.4 kg)  Height: 5' 2 (1.575 m)    Body mass index is 34.02 kg/m.   General: The patient is well-developed and well-nourished and in no acute distress   Neurologic Exam  Mental status: The patient is alert and oriented x 3 at the time of the examination. The patient has apparent normal recent and remote memory, with an apparently normal attention span and concentration ability.   Speech is normal.  Cranial nerves: Extraocular movements are full.  Facial strength and sensation was normal.  Trapezius strength was normal.    No obvious hearing deficits are noted.  Motor:  Muscle bulk is normal.   Tone is normal. Strength is  5 / 5 in all 4 extremities.   Sensory: Intact sensation to touch and vibration in hands and legs.  No Tinels signs  Coordination: She performs finger-nose-finger and heel-to-shin well.  Gait and station: Station is normal.  Gait is normal.  Tandem gait is mildly wide.  Romberg is negative.   Reflexes: Deep tendon reflexes are symmetric.  Deep tendon reflexes are increased at the knees with spread.  DTRs increased at the ankles but no clonus.    ASSESSMENT AND PLAN  Multiple sclerosis (HCC) - Plan: CBC with Differential/Platelet  Migraine without aura and without status migrainosus, not intractable  High risk medication use - Plan: CBC with Differential/Platelet  Low vitamin D  level  Bilateral carpal tunnel syndrome  Anxiety and depression  Morton's neuroma of both feet  Episodic migraine    1.   Continue vumerity .  Check CBC/D 2.   Gabapentin  for neuralgia.  3.   Stay active and exercise as tolerated. 4.    Rizatriptan  as needed headache 5.   Return in 6 months or sooner if there are new or worsening neurologic symptoms.   Arjan Strohm A. Vear, MD, PhD, FAAN Certified in Neurology, Clinical Neurophysiology, Sleep Medicine, Pain Medicine and Neuroimaging Director, [facility] at [facility]  [facility] [address] [place], [place] [address] [phone]

## 2023-01-17 LAB — CBC WITH DIFFERENTIAL/PLATELET
Basophils Absolute: 0.1 10*3/uL (ref 0.0–0.2)
Basos: 1 %
EOS (ABSOLUTE): 0.1 10*3/uL (ref 0.0–0.4)
Eos: 1 %
Hematocrit: 43.8 % (ref 34.0–46.6)
Hemoglobin: 14.3 g/dL (ref 11.1–15.9)
Immature Grans (Abs): 0 10*3/uL (ref 0.0–0.1)
Immature Granulocytes: 0 %
Lymphocytes Absolute: 3.3 10*3/uL — ABNORMAL HIGH (ref 0.7–3.1)
Lymphs: 36 %
MCH: 28.9 pg (ref 26.6–33.0)
MCHC: 32.6 g/dL (ref 31.5–35.7)
MCV: 89 fL (ref 79–97)
Monocytes Absolute: 0.8 10*3/uL (ref 0.1–0.9)
Monocytes: 9 %
Neutrophils Absolute: 4.9 10*3/uL (ref 1.4–7.0)
Neutrophils: 53 %
Platelets: 305 10*3/uL (ref 150–450)
RBC: 4.95 x10E6/uL (ref 3.77–5.28)
RDW: 11.6 % — ABNORMAL LOW (ref 11.7–15.4)
WBC: 9.2 10*3/uL (ref 3.4–10.8)

## 2023-01-18 ENCOUNTER — Other Ambulatory Visit: Payer: Self-pay | Admitting: Neurology

## 2023-01-18 DIAGNOSIS — G35 Multiple sclerosis: Secondary | ICD-10-CM

## 2023-01-24 ENCOUNTER — Telehealth: Payer: Self-pay | Admitting: Neurology

## 2023-01-24 DIAGNOSIS — G35 Multiple sclerosis: Secondary | ICD-10-CM

## 2023-01-24 MED ORDER — VUMERITY 231 MG PO CPDR: 462.0000 mg | DELAYED_RELEASE_CAPSULE | Freq: Two times a day (BID) | ORAL | 5 refills | Status: DC

## 2023-01-24 NOTE — Telephone Encounter (Signed)
Pt is requesting a refill for VUMERITY 231 MG CPDR  .  Pharmacy: [facility]

## 2023-01-24 NOTE — Telephone Encounter (Signed)
Refill sent for the patient to the pharmacy

## 2023-01-31 ENCOUNTER — Other Ambulatory Visit: Payer: Self-pay | Admitting: Obstetrics and Gynecology

## 2023-01-31 DIAGNOSIS — Z1231 Encounter for screening mammogram for malignant neoplasm of breast: Secondary | ICD-10-CM

## 2023-02-07 DIAGNOSIS — R12 Heartburn: Secondary | ICD-10-CM | POA: Insufficient documentation

## 2023-02-08 DIAGNOSIS — J302 Other seasonal allergic rhinitis: Secondary | ICD-10-CM | POA: Insufficient documentation

## 2023-02-08 DIAGNOSIS — E538 Deficiency of other specified B group vitamins: Secondary | ICD-10-CM | POA: Insufficient documentation

## 2023-02-21 ENCOUNTER — Ambulatory Visit
Admission: RE | Admit: 2023-02-21 | Discharge: 2023-02-21 | Disposition: A | Discharge status: Home / Self Care | Payer: 59 | Source: Ambulatory Visit | Attending: Obstetrics and Gynecology | Admitting: Obstetrics and Gynecology

## 2023-02-21 DIAGNOSIS — Z1231 Encounter for screening mammogram for malignant neoplasm of breast: Secondary | ICD-10-CM

## 2023-03-19 ENCOUNTER — Other Ambulatory Visit: Payer: Self-pay | Admitting: Nurse Practitioner

## 2023-03-19 DIAGNOSIS — E063 Autoimmune thyroiditis: Secondary | ICD-10-CM

## 2023-03-27 ENCOUNTER — Ambulatory Visit: Attending: Nurse Practitioner | Admitting: Audiology

## 2023-03-27 DIAGNOSIS — H5789 Other specified disorders of eye and adnexa: Secondary | ICD-10-CM | POA: Diagnosis present

## 2023-03-27 DIAGNOSIS — E079 Disorder of thyroid, unspecified: Secondary | ICD-10-CM | POA: Insufficient documentation

## 2023-03-27 NOTE — Procedures (Signed)
  Outpatient Audiology and [facility] [address] [place], [place]  [address] [phone]  AUDIOLOGICAL  EVALUATION  NAME: Chelsea Gentry     DOB:   1980-12-04      MRN: [id number]                                                                                     DATE: 03/27/2023     REFERENT: Heron Nay, PA STATUS: Outpatient DIAGNOSIS: Thyroid Eye Disease    History: [name] was seen for an audiological evaluation due to her history of thyroid eye disease. [name]'s medical history is also significant for Grave's disease. [name] was referred for a baseline hearing evaluation before starting Tepezza for Thyroid Eye disease. [name] denies concerns regarding her hearing sensitivity. [name] denies otalgia, aural fullness, tinnitus, and dizziness.   Evaluation:  Otoscopy showed a clear view of the tympanic membranes, bilaterally Tympanometry results were consistent with normal middle ear pressure and normal tympanic membrane mobility (Type A), bilaterally.  Audiometric testing was completed using Conventional Audiometry techniques with insert earphones and TDH headphones. Test results are consistent with normal hearing sensitivity at [phone], bilaterally. The Audiogram and the high frequency audiogram can be found under the media file. Speech Recognition Thresholds were obtained at 15 dB HL in the right ear and at 15  dB HL in the left ear. Word Recognition Testing was completed at 60 dB HL and [name] scored 100%, bilaterally masked.    Results:  The test results were reviewed with [facility]. Today's test results are consistent with normal hearing sensitivity at [phone], bilaterally. The audiogram for the standard audiogram and the high frequency audiogram is under the media file. Audiological monitoring is recommended due to the Tepezza medication.   Recommendations: 1.   Monitor hearing sensitivity. Return for a repeat audiological evaluation on 09/29/23.    30 minutes  spent testing and counseling on results.   If you have any questions please feel free to contact me at [phone].  Marton Redwood Audiologist, Au.D., CCC-A 03/27/2023  11:09 AM  Cc: Heron Nay, PA

## 2023-03-28 ENCOUNTER — Ambulatory Visit
Admission: RE | Admit: 2023-03-28 | Discharge: 2023-03-28 | Disposition: A | Discharge status: Home / Self Care | Source: Ambulatory Visit | Attending: Nurse Practitioner | Admitting: Nurse Practitioner

## 2023-03-28 DIAGNOSIS — E063 Autoimmune thyroiditis: Secondary | ICD-10-CM

## 2023-07-06 ENCOUNTER — Other Ambulatory Visit: Payer: Self-pay | Admitting: Neurology

## 2023-07-06 DIAGNOSIS — G35 Multiple sclerosis: Secondary | ICD-10-CM

## 2023-07-07 ENCOUNTER — Other Ambulatory Visit: Payer: Self-pay

## 2023-07-07 NOTE — Telephone Encounter (Signed)
 Last refill: 06/13/2023  Pt's upcoming Appointment 07/16/2023 (Amy Lomax)

## 2023-07-15 NOTE — Patient Instructions (Signed)

## 2023-07-15 NOTE — Progress Notes (Unsigned)
 No chief complaint on file.   HISTORY OF PRESENT ILLNESS:  07/15/23 ALL:  Chelsea Gentry is a 43 y.o. 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 MRI cervical spine and brain stable 03/2020. Labs have been normal. Last MRI brain 02/2022 was stable.   She feels that she is doing fairly well. No new or worsening symptoms. Gait is stable. She is working with endocrinology for Graves Disease. She is now on methimazole.   Migraines are easily aborted with rizatriptan . Se can not remember the last time she had to take it.   She feels mood is stable. She stopped Adderall due to elevated heart rate. She is working with [facility] for ADHD and mood management. She feels mood is good on sertraline  and buspirone. She is sleeping well.    She is having urge incontinence but feels it is a little better, recently. She is having more diarrhea with adjustments of thyroid meds.   She continues vitamin D  supplements, 7000iu daily. She was seen by Dasie Corona with [facility] in 02/2022. Exam was normal.   HISTORY (copied from Dr Duncan previous note)  Chelsea Gentry is a 43 y.o. woman with relapsing remitting multiple sclerosis diagnosed in 2016    Update 01/16/2023: She has been on Vumerity  since 2021, switching from Tecfidera /DM due to copay issues.   She tolerates it well.  No GI issues.  She has rare flushing, much less than initially.    She has no exacerbations.    Existing neurologic symptoms  are stable,   Gait and balance are doing well.  She has no trouble with stairs and does not need the bannister but sometimes uses it.  Feels a litle off on unbalanced surfaces.    Hand numbness resolved since her [facility] for bilateral CTS (Dr. Alyse).    She has some pain in her feet.  Vision is stable.   Bladder has occasioanal stress incontinence.   She has never taken anything for it and would prefer to hold off for now.      She has some fatigue.  She has some depression and anxiety.    She is on Cymbalta , Buspar. 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    Adderall 20 mg IR bid helped but he felt mean.  However, Ritalin and Strattera did not help as much and she may go back.     She has migraine headaches, but these are less common, averages 1/month.   Rizatriptan  helps.    She gets nausea at times.      She ws diagnosed with Graves disease and her thyroid function improvd.  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      MS History: She was diagnosed with MS in October 2016. In retrospect, about 6 or 7 years ago she had numbness in her hands more than her feet that was moderate for several weeks before it improved. At that time, she was found to have a low vitamin D  and that was blamed for the incident.  In late September 2016, she presented with left visual blurring.    She had an MRI of the brain consistent with MS.   She received po steroids and did better    Her headaches associated with the ON also improved.    She was first on Copaxone  but had swithced   She had 5 seizures over 3-4 years around ages [age] but  none the past 14-15 years.   Poor sleep may have triggered some of the seizures.   One occurred after taking a diet energy pill.      IMAGING  MRIs of the brain from 10/11/2014, 10/13/2014 and 05/20/2016. The MRIs from October 2016 showed enhancement of the left optic nerve consistent with optic neuritis. Additionally, there were multiple T2/FLAIR hyperintense foci in the periventricular, juxtacortical and deep white matter and also one small one in the right cerebellar hemisphere. These were consistent with a diagnosis of multiple sclerosis. The MRI from 05/20/2016 shows the chronic lesions and additionally shows at least 6 new lesions in the periventricular, deep and juxtacortical white matter with 2 enhancing lesions.     MRI of the cervical spine 08/22/2017 showed a focus to the right at [facility].and possible focus at T1.     MRI of the  brain 08/24/2017 showed  Multiple T2/FLAIR hyperintense foci in the hemispheres and the pattern and configuration consistent with chronic demyelinating plaque associated with multiple sclerosis.  None of the foci appears to be acute.  When compared to the MRI dated 10/11/2014, there are a couple more foci on the current MRI.   MRI of the brain 12/18/2018 showed multiple T2/flair hyperintense foci in the hemispheres and 1 in the upper spinal cord in a pattern and configuration consistent with chronic demyelinating plaque associated with multiple sclerosis.  None of the foci appears to be acute.  Compared to the MRI dated 08/22/2017, there are no new lesions.   MR of the brain 03/17/2020 showed Multiple T2 hyperintense foci in the hemispheres and upper spinal cord in a pattern and configuration consistent with chronic demyelinating plaque associated with multiple sclerosis.  None of the foci appear to be acute.  Compared to the MRI dated 12/18/2018, there are no new lesions.   MRI of the cervical spine 03/17/2020 showed  T2 hyperintense focus towards the right adjacent to C2-C3 unchanged compared to the previous MRI from 08/22/2017 and consistent with a chronic demyelinating plaque associated with multiple sclerosis.      Disc protrusion at C5-C6 has progressed compared to the previous MRI but does not lead to spinal stenosis or nerve root compression   MRI brain 02/08/22 showed no new lesions compared to 06/18/2021    REVIEW OF SYSTEMS: Out of a complete 14 system review of symptoms, the patient complains only of the following symptoms, headaches, depression, anxiety,  and all other reviewed systems are negative.    ALLERGIES: Allergies  Allergen Reactions   Shellfish Allergy Anaphylaxis     HOME MEDICATIONS: Outpatient Medications Prior to Visit  Medication Sig Dispense Refill   ADDERALL 20 MG tablet Take 20 mg by mouth daily.     busPIRone (BUSPAR) 10 MG tablet Take 10 mg by mouth 2 (two) times  daily.     Cholecalciferol (VITAMIN D  PO) Take 7,000 Units by mouth daily.     Diroximel Fumarate  (VUMERITY ) 231 MG CPDR TAKE 2 CAPSULES BY MOUTH EVERY  MORNING AND 2 CAPSULES BY MOUTH  EVERY NIGHT AT BEDTIME 120 capsule 0   gabapentin  (NEURONTIN ) 300 MG capsule Take 1 capsule (300 mg total) by mouth 3 (three) times daily. 90 capsule 11   methIMAzole (TAPAZOLE) 2.5 MG TABS tablet Take by mouth daily.     methimazole (TAPAZOLE) 5 MG tablet Take 7.5 mg by mouth daily.     montelukast (SINGULAIR) 10 MG tablet Take 10 mg by mouth at bedtime.     omeprazole (PRILOSEC)  40 MG capsule Take 40 mg by mouth daily.     rizatriptan  (MAXALT ) 10 MG tablet Take 1 tablet (10 mg total) by mouth as needed for migraine. May repeat in 2 hours if needed 10 tablet 11   sertraline  (ZOLOFT ) 50 MG tablet Take 50 mg by mouth daily.     No facility-administered medications prior to visit.     PAST MEDICAL HISTORY: Past Medical History:  Diagnosis Date   Anxiety    Dysmenorrhea    Multiple sclerosis (HCC)    Seizures (HCC)    Vision abnormalities      PAST SURGICAL HISTORY: Past Surgical History:  Procedure Laterality Date   DILATION AND EVACUATION N/A 12/18/2013   Procedure: DILATATION AND EVACUATION Ultrasound guided;  Surgeon: Rexene PARAS. Rosalva, MD;  Location: [facility];  Service: Gynecology;  Laterality: N/A;   TONSILLECTOMY       FAMILY HISTORY: Family History  Problem Relation Age of Onset   Liver cancer Mother    Heart disease Father    AAA (abdominal aortic aneurysm) Father    Hypertension Father    High Cholesterol Father    Atrial fibrillation Father    Ovarian cancer Maternal Aunt    Ovarian cancer Paternal Aunt    Healthy Brother      SOCIAL HISTORY: Social History   Socioeconomic History   Marital status: Married    Spouse name: Not on file   Number of children: Not on file   Years of education: Not on file   Highest education level: Not on file  Occupational History   Not on file   Tobacco Use   Smoking status: Every Day    Current packs/day: 0.50    Types: Cigarettes   Smokeless tobacco: Never  Substance and Sexual Activity   Alcohol use: Yes    Comment: seldom   Drug use: Yes    Types: Marijuana    Comment: Almost Daily   Sexual activity: Yes  Other Topics Concern   Not on file  Social History Narrative   Not on file   Social Drivers of Health   Financial Resource Strain: Low Risk  (03/07/2023)   Received from [facility]   Overall Financial Resource Strain (CARDIA)    Difficulty of Paying Living Expenses: Not very hard  Food Insecurity: No Food Insecurity (03/07/2023)   Received from [facility]   Hunger Vital Sign    Within the past 12 months, you worried that your food would run out before you got the money to buy more.: Never true    Within the past 12 months, the food you bought just didn't last and you didn't have money to get more.: Never true  Transportation Needs: No Transportation Needs (03/07/2023)   Received from [facility] - Transportation    Lack of Transportation (Medical): No    Lack of Transportation (Non-Medical): No  Physical Activity: Unknown (03/07/2023)   Received from [facility]   Exercise Vital Sign    On average, how many days per week do you engage in moderate to strenuous exercise (like a brisk walk)?: 0 days    Minutes of Exercise per Session: Not on file  Stress: Stress Concern Present (03/07/2023)   Received from [facility] - Occupational Stress Questionnaire    Feeling of Stress : To some extent  Social Connections: Moderately Integrated (03/07/2023)   Received from [facility]   Social Network  How would you rate your social network (family, work, friends)?: Adequate participation with social networks  Intimate Partner Violence: Not At Risk (03/07/2023)   Received from [facility]   HITS    Over the last 12 months how often did your partner  physically hurt you?: Never    Over the last 12 months how often did your partner insult you or talk down to you?: Never    Over the last 12 months how often did your partner threaten you with physical harm?: Never    Over the last 12 months how often did your partner scream or curse at you?: Rarely      PHYSICAL EXAM  There were no vitals filed for this visit.    There is no height or weight on file to calculate BMI.   Generalized: Well developed, in no acute distress  Cardiology: normal rate and rhythm, no murmur auscultated  Respiratory: clear to auscultation bilaterally    Neurological examination  Mentation: Alert oriented to time, place, history taking. Follows all commands speech and language fluent Cranial nerve II-XII: Pupils were equal round reactive to light. Extraocular movements were full, visual field were full on confrontational test. Facial sensation and strength were normal. Head turning and shoulder shrug  were normal and symmetric. Motor: The motor testing reveals 5 over 5 strength of all 4 extremities. Good symmetric motor tone is noted throughout.  Sensory: Sensory testing is intact to soft touch on all 4 extremities. No evidence of extinction is noted.  Coordination: Cerebellar testing reveals good finger-nose-finger and heel-to-shin bilaterally.  Gait and station: Gait is normal.  Reflexes: Deep tendon reflexes are symmetric and normal bilaterally.     DIAGNOSTIC DATA (LABS, IMAGING, TESTING) - I reviewed patient records, labs, notes, testing and imaging myself where available.  Lab Results  Component Value Date   WBC 9.2 01/16/2023   HGB 14.3 01/16/2023   HCT 43.8 01/16/2023   MCV 89 01/16/2023   PLT 305 01/16/2023      Component Value Date/Time   NA 138 06/17/2022 1427   K 4.0 06/17/2022 1427   CL 103 06/17/2022 1427   CO2 24 06/17/2022 1427   GLUCOSE 114 (H) 06/17/2022 1427   GLUCOSE 90 03/27/2022 1420   BUN 9 06/17/2022 1427   CREATININE  0.67 06/17/2022 1427   CALCIUM 9.2 06/17/2022 1427   PROT 6.3 06/17/2022 1427   ALBUMIN 4.3 06/17/2022 1427   AST 13 06/17/2022 1427   ALT 12 06/17/2022 1427   ALKPHOS 65 06/17/2022 1427   BILITOT 0.3 06/17/2022 1427   GFRNONAA >60 03/27/2022 1420   No results found for: CHOL, HDL, LDLCALC, LDLDIRECT, TRIG, CHOLHDL No results found for: YHAJ8R No results found for: VITAMINB12 Lab Results  Component Value Date   TSH 2.720 06/19/2016        No data to display               No data to display           ASSESSMENT AND PLAN  43 y.o. year old female  has a past medical history of Anxiety, Dysmenorrhea, Multiple sclerosis (HCC), Seizures (HCC), and Vision abnormalities. here with     No diagnosis found.  Victorine is doing well, today. We will continue Vumerity . I will update labs. She will continue rizatriptan  PRN. Healthy lifestyle habits encouraged. Continue close follow up with care team. She will follow up with Dr Vear in 6 months, sooner if needed.   No  orders of the defined types were placed in this encounter.    No orders of the defined types were placed in this encounter.    Greig Forbes, MSN, FNP-C 07/15/2023, 12:19 PM  [facility] [address] [place], [place] [address] [phone]

## 2023-07-16 ENCOUNTER — Ambulatory Visit (INDEPENDENT_AMBULATORY_CARE_PROVIDER_SITE_OTHER): Payer: 59 | Admitting: Family Medicine

## 2023-07-16 ENCOUNTER — Encounter: Payer: Self-pay | Admitting: Family Medicine

## 2023-07-16 VITALS — BP 162/101 | HR 88 | Ht 62.0 in | Wt 195.5 lb

## 2023-07-16 DIAGNOSIS — F419 Anxiety disorder, unspecified: Secondary | ICD-10-CM | POA: Diagnosis not present

## 2023-07-16 DIAGNOSIS — Z79899 Other long term (current) drug therapy: Secondary | ICD-10-CM | POA: Diagnosis not present

## 2023-07-16 DIAGNOSIS — R7989 Other specified abnormal findings of blood chemistry: Secondary | ICD-10-CM

## 2023-07-16 DIAGNOSIS — G35 Multiple sclerosis: Secondary | ICD-10-CM

## 2023-07-16 DIAGNOSIS — G43009 Migraine without aura, not intractable, without status migrainosus: Secondary | ICD-10-CM | POA: Diagnosis not present

## 2023-07-16 DIAGNOSIS — F32A Depression, unspecified: Secondary | ICD-10-CM

## 2023-07-16 MED ORDER — RIZATRIPTAN BENZOATE 10 MG PO TABS: 10.0000 mg | ORAL_TABLET | ORAL | 11 refills | Status: AC | PRN

## 2023-07-17 ENCOUNTER — Ambulatory Visit: Payer: Self-pay | Admitting: Family Medicine

## 2023-07-17 LAB — CBC WITH DIFFERENTIAL/PLATELET
Basophils Absolute: 0.1 x10E3/uL (ref 0.0–0.2)
Basos: 1 %
EOS (ABSOLUTE): 0.1 x10E3/uL (ref 0.0–0.4)
Eos: 1 %
Hematocrit: 44.2 % (ref 34.0–46.6)
Hemoglobin: 14.7 g/dL (ref 11.1–15.9)
Immature Grans (Abs): 0 x10E3/uL (ref 0.0–0.1)
Immature Granulocytes: 0 %
Lymphocytes Absolute: 3.5 x10E3/uL — ABNORMAL HIGH (ref 0.7–3.1)
Lymphs: 38 %
MCH: 29.9 pg (ref 26.6–33.0)
MCHC: 33.3 g/dL (ref 31.5–35.7)
MCV: 90 fL (ref 79–97)
Monocytes Absolute: 0.6 x10E3/uL (ref 0.1–0.9)
Monocytes: 6 %
Neutrophils Absolute: 4.8 x10E3/uL (ref 1.4–7.0)
Neutrophils: 54 %
Platelets: 276 x10E3/uL (ref 150–450)
RBC: 4.92 x10E6/uL (ref 3.77–5.28)
RDW: 13.2 % (ref 11.7–15.4)
WBC: 9 x10E3/uL (ref 3.4–10.8)

## 2023-08-05 ENCOUNTER — Other Ambulatory Visit: Payer: Self-pay | Admitting: Neurology

## 2023-08-05 ENCOUNTER — Other Ambulatory Visit: Payer: Self-pay

## 2023-08-05 DIAGNOSIS — G35 Multiple sclerosis: Secondary | ICD-10-CM

## 2023-08-07 ENCOUNTER — Encounter: Payer: Self-pay | Admitting: Family Medicine

## 2023-08-07 NOTE — Telephone Encounter (Signed)
 Pt has returned call to Amy, NP, she will accept a call back .

## 2023-09-10 ENCOUNTER — Other Ambulatory Visit: Payer: Self-pay | Admitting: Neurology

## 2023-09-10 DIAGNOSIS — G35 Multiple sclerosis: Secondary | ICD-10-CM

## 2023-09-10 NOTE — Telephone Encounter (Signed)
 Last seen on 07/16/23 Follow up scheduled 02/18/23

## 2023-09-18 ENCOUNTER — Ambulatory Visit: Attending: Nurse Practitioner | Admitting: Audiologist

## 2023-09-18 DIAGNOSIS — H9103 Ototoxic hearing loss, bilateral: Secondary | ICD-10-CM | POA: Insufficient documentation

## 2023-09-18 DIAGNOSIS — E079 Disorder of thyroid, unspecified: Secondary | ICD-10-CM | POA: Insufficient documentation

## 2023-09-18 DIAGNOSIS — H5789 Other specified disorders of eye and adnexa: Secondary | ICD-10-CM | POA: Insufficient documentation

## 2023-09-18 NOTE — Procedures (Signed)
  Outpatient Audiology and [facility] [address] [place], [place]  [address] [phone]  Ototoxic Monitoring Baseline Audiologic Evaluation   NAME: Chelsea Gentry     DOB:   08-23-1980      MRN: [id number]                                                                                     DATE: 09/18/2023     REFERENT: Teressa STATUS: Outpatient DIAGNOSIS: Audiologic Screening for the Purpose of Ototoxic Monitoring   History: Chelsea Gentry was seen for an audiological evaluation. . Chelsea Gentry is receiving a hearing screening during treatment with ototoxic mediation. She was last seen in March. She has had two infusions since her baseline test. She denies any new tinnitus, difficulty hearing, pain or pressure in either ear.    Evaluation:  Otoscopy showed a clear view of the tympanic membranes, bilaterally Audiometric testing was completed using high frequency transducer.  Sensitive Range to Ototoxicity (SRO)  is 8 to 16kHz  10-20dB decline in hearing at all frequencies above 8kHz.  This is a significant change compared to baseline    Results:  The test results were reviewed with Chelsea Gentry.  Today's high frequency screening shows a decline in her hearing from 8-16kHz with more decline in the left ear. She has had two infusions. It has been six months since her last hearing test. She will need to follow up after her next infusion to see if decline in hearing continues.   Recommendations: Return for audiological evaluation 10/09/23 after next infusion to  monitor hearing for continued decline.  Chelsea Gentry Teressa NP: Please place new referral for ototoxic monitoring to [facility], new referrals re required ever six months for monitoring.   18 minutes spent testing and counseling on results.    Chelsea Gentry  Audiologist, Au.D., CCC-A 09/18/2023  9:17 AM  Cc:  Teressa

## 2023-09-29 ENCOUNTER — Ambulatory Visit: Admitting: Audiologist

## 2023-10-09 ENCOUNTER — Ambulatory Visit: Attending: Physician Assistant | Admitting: Audiologist

## 2023-10-09 DIAGNOSIS — E079 Disorder of thyroid, unspecified: Secondary | ICD-10-CM | POA: Insufficient documentation

## 2023-10-09 DIAGNOSIS — H5789 Other specified disorders of eye and adnexa: Secondary | ICD-10-CM | POA: Insufficient documentation

## 2023-10-09 DIAGNOSIS — H9103 Ototoxic hearing loss, bilateral: Secondary | ICD-10-CM | POA: Insufficient documentation

## 2023-10-09 NOTE — Procedures (Signed)
  Outpatient Audiology and [facility] [address] [place], [place]  [address] [phone]  AUDIOLOGICAL  EVALUATION  NAME: Chelsea Gentry     DOB:   [birth year]      MRN: [id number]                                                                                     DATE: 10/09/2023     REFERENT: Neysa Tinnie FORBES, PA STATUS: Outpatient DIAGNOSIS:    History: Chelsea Gentry was seen for an audiological screening to monitor hearing for progression due to treatment with Ototoxic medication. She is having sensitivity to the sound of thunder on her noise machine. Otherwise no decline hearing noted and no new symptoms such as tinnitus.   Evaluation:  Otoscopy showed a clear view of the tympanic membranes, bilaterally Audiometric testing was completed using pure tones only 4kHz and above. Hearing stable. No decline compared to 09/18/2023.   Results:  The test results were reviewed with Chelsea Gentry.  Today's high frequency screening shows stable hearing compared to previous audiogram. No continued progression of thresholds.  Audiological monitoring is still recommended due to the Tepezza medication and past progression of thresholds. Return sooner if changes are perceived.  Hearing can be more sensitive when taking ototoxic medication. Wear hearing protection when around loud noise and void triggering sounds.    Recommendations: New referral needed due to it being six months past initial referral, will be requested from referring provider.  Follow up scheduled  02/04/2023.   12 minutes spent testing and counseling on results.   If you have any questions please feel free to contact me at [phone].  Lauraine Ka Stalnaker Au.D.  Audiologist   10/09/2023  12:02 PM  Cc: Neysa Tinnie FORBES, PA

## 2023-10-17 ENCOUNTER — Telehealth: Payer: Self-pay | Admitting: Pharmacist

## 2023-10-17 NOTE — Telephone Encounter (Signed)
 Pharmacy Patient Advocate Encounter   Received notification from Patient Pharmacy that prior authorization for VUMERITY  231MG  delayed-release capsules is required/requested.   Insurance verification completed.   The patient is insured through [facility].   Per test claim: PA required; PA submitted to above mentioned insurance via Latent Key/confirmation #/EOC  [id number] Status is pending

## 2023-10-20 NOTE — Telephone Encounter (Signed)
 Pharmacy Patient Advocate Encounter  Received notification from [facility] that Prior Authorization for VUMERITY  231 MG PO CPDR has been DENIED.  See denial reason below. No denial letter attached in CMM. Will attach denial letter to Media tab once received.   PA #/Case ID/Reference #: [id number]

## 2023-10-21 ENCOUNTER — Telehealth: Payer: Self-pay | Admitting: Pharmacist

## 2023-10-21 NOTE — Telephone Encounter (Signed)
 A request for an urgent Appeal has been submitted for Vumerity . Will advise when response is received.   Appeal letter and supporting information have been faxed to [phone] on 10/21/2023 @10 :56 am.  Thank you, Devere Pandy, PharmD Clinical Pharmacist  [place]  Direct Dial: [phone]

## 2023-10-21 NOTE — Telephone Encounter (Signed)
 Hello, can you urgently appeal?  We appealed last yr and it was approved. She has been on Vumerity  since 2021 and tolerating well. She has tried/failed: Tecfidera /DMF. Changing medication will put her at higher risk for relapse.

## 2023-10-22 NOTE — Telephone Encounter (Signed)
 Tami from [facility] called stating the medication appeal has been approved 10/22/23-10/21/24 Case# [id number]

## 2023-11-21 ENCOUNTER — Other Ambulatory Visit: Payer: Self-pay | Admitting: Obstetrics and Gynecology

## 2023-11-21 DIAGNOSIS — Z1231 Encounter for screening mammogram for malignant neoplasm of breast: Secondary | ICD-10-CM

## 2024-02-04 ENCOUNTER — Ambulatory Visit: Attending: Physician Assistant | Admitting: Audiologist

## 2024-02-04 ENCOUNTER — Encounter: Payer: Self-pay | Admitting: Audiologist

## 2024-02-04 ENCOUNTER — Ambulatory Visit: Admitting: Audiologist

## 2024-02-04 DIAGNOSIS — E079 Disorder of thyroid, unspecified: Secondary | ICD-10-CM | POA: Insufficient documentation

## 2024-02-04 DIAGNOSIS — H9103 Ototoxic hearing loss, bilateral: Secondary | ICD-10-CM | POA: Insufficient documentation

## 2024-02-04 DIAGNOSIS — H5789 Other specified disorders of eye and adnexa: Secondary | ICD-10-CM | POA: Insufficient documentation

## 2024-02-04 NOTE — Procedures (Signed)
" °  Outpatient Audiology and [facility] [address] [place], [place]  [address] [phone]  AUDIOLOGICAL  EVALUATION  NAME: Chelsea Gentry     DOB:   January 05, 1981      MRN: [id number]                                                                                     DATE: 02/04/2024     REFERENT: Neysa Tinnie FORBES, PA STATUS: Outpatient DIAGNOSIS: Thyroid Eye Disease    History: [name] was seen for an audiological screening to monitor hearing for progression due to treatment with Ototoxic medication. She is noticing difficulty hearing from a distance. Otherwise no decline hearing noted and no new symptoms such as tinnitus. Last test 10/09/2023 showed no progression of hearing loss.   Evaluation:  Otoscopy showed a clear view of the tympanic membranes, bilaterally Audiometric testing was completed using pure tones only 8kHz and above. Hearing stable. No decline compared to previous thresholds in the left ear. The right ear shows a decline of 10-20dB from 9-16kHz.    Results:  The test results were reviewed with [name].  Today's high frequency screening shows a decrease in her right ear hearing compared to previous audiogram. Hearing is still normal in range of a typical audiogram. All changes are at high frequencies above 8kHz. Return sooner if changes are perceived.    Recommendations: Follow up scheduled in three months, this will be the last monitoring appointment due to cessation of ototoxic medication earlier this year.     18 minutes spent testing and counseling on results.   If you have any questions please feel free to contact me at [phone].  Lauraine Ka Stalnaker Au.D.  Audiologist   02/04/2024  2:52 PM  Cc: Neysa Tinnie FORBES, PA  "

## 2024-02-09 ENCOUNTER — Other Ambulatory Visit: Payer: Self-pay | Admitting: Neurology

## 2024-02-18 ENCOUNTER — Ambulatory Visit: Admitting: Neurology

## 2024-02-27 ENCOUNTER — Ambulatory Visit

## 2024-05-05 ENCOUNTER — Ambulatory Visit: Admitting: Audiologist
